# Patient Record
Sex: Male | Born: 1995
Health system: Southern US, Community
[De-identification: ages and names within clinical notes are randomized; demographics above are authoritative.]

## PROBLEM LIST (undated history)

## (undated) DIAGNOSIS — J189 Pneumonia, unspecified organism: Secondary | ICD-10-CM

## (undated) DIAGNOSIS — J45909 Unspecified asthma, uncomplicated: Secondary | ICD-10-CM

## (undated) HISTORY — DX: Unspecified asthma, uncomplicated: J45.909

---
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
## 2016-08-01 ENCOUNTER — Emergency Department (HOSPITAL_COMMUNITY)
Admission: EM | Admit: 2016-08-01 | Discharge: 2016-08-01 | Disposition: A | Discharge status: Home / Self Care | Payer: No Typology Code available for payment source | Attending: Emergency Medicine | Admitting: Emergency Medicine

## 2016-08-01 ENCOUNTER — Encounter (HOSPITAL_COMMUNITY): Payer: Self-pay | Admitting: Emergency Medicine

## 2016-08-01 DIAGNOSIS — Y939 Activity, unspecified: Secondary | ICD-10-CM | POA: Diagnosis not present

## 2016-08-01 DIAGNOSIS — R51 Headache: Secondary | ICD-10-CM | POA: Insufficient documentation

## 2016-08-01 DIAGNOSIS — Z79899 Other long term (current) drug therapy: Secondary | ICD-10-CM | POA: Diagnosis not present

## 2016-08-01 DIAGNOSIS — M542 Cervicalgia: Secondary | ICD-10-CM | POA: Diagnosis not present

## 2016-08-01 DIAGNOSIS — J45909 Unspecified asthma, uncomplicated: Secondary | ICD-10-CM | POA: Insufficient documentation

## 2016-08-01 DIAGNOSIS — M7918 Myalgia, other site: Secondary | ICD-10-CM

## 2016-08-01 DIAGNOSIS — Y92410 Unspecified street and highway as the place of occurrence of the external cause: Secondary | ICD-10-CM | POA: Diagnosis not present

## 2016-08-01 DIAGNOSIS — Y999 Unspecified external cause status: Secondary | ICD-10-CM | POA: Insufficient documentation

## 2016-08-01 MED ORDER — IBUPROFEN 200 MG PO TABS
600.0000 mg | ORAL_TABLET | Freq: Once | ORAL | Status: AC
  Administered 2016-08-01: 600 mg via ORAL
  Filled 2016-08-01: qty 3

## 2016-08-01 MED ORDER — METHOCARBAMOL 500 MG PO TABS
500.0000 mg | ORAL_TABLET | Freq: Once | ORAL | Status: AC
  Administered 2016-08-01: 500 mg via ORAL
  Filled 2016-08-01: qty 1

## 2016-08-01 MED ORDER — METHOCARBAMOL 500 MG PO TABS: 500.0000 mg | ORAL_TABLET | Freq: Two times a day (BID) | ORAL | 0 refills | Status: DC

## 2016-08-01 MED ORDER — NAPROXEN 500 MG PO TABS
500.0000 mg | ORAL_TABLET | Freq: Two times a day (BID) | ORAL | 0 refills | Status: DC
Start: 2016-08-01 — End: 2018-05-12
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
## 2017-03-24 NOTE — ED Notes (Signed)
Pt. Called no response x 2. 

## 2017-03-25 ENCOUNTER — Encounter (HOSPITAL_COMMUNITY): Payer: Self-pay | Admitting: *Deleted

## 2017-03-25 ENCOUNTER — Emergency Department (HOSPITAL_COMMUNITY): Admission: EM | Admit: 2017-03-25 | Discharge: 2017-03-25 | Discharge status: Against Medical Advice | Payer: Self-pay

## 2017-03-25 ENCOUNTER — Ambulatory Visit (HOSPITAL_COMMUNITY)
Admission: EM | Admit: 2017-03-25 | Discharge: 2017-03-25 | Disposition: A | Discharge status: Home / Self Care | Payer: Self-pay | Attending: Emergency Medicine | Admitting: Emergency Medicine

## 2017-03-25 DIAGNOSIS — R0981 Nasal congestion: Secondary | ICD-10-CM

## 2017-03-25 DIAGNOSIS — R05 Cough: Secondary | ICD-10-CM

## 2017-03-25 DIAGNOSIS — J4521 Mild intermittent asthma with (acute) exacerbation: Secondary | ICD-10-CM

## 2017-03-25 DIAGNOSIS — R0982 Postnasal drip: Secondary | ICD-10-CM

## 2017-03-25 MED ORDER — FEXOFENADINE HCL 180 MG PO TABS: 180.0000 mg | ORAL_TABLET | Freq: Every day | ORAL | 0 refills | Status: DC

## 2017-03-25 MED ORDER — FLUTICASONE PROPIONATE 50 MCG/ACT NA SUSP: 1.0000 | Freq: Every day | NASAL | 2 refills | Status: DC

## 2017-03-25 MED ORDER — PREDNISONE 10 MG (21) PO TBPK: ORAL_TABLET | Freq: Every day | ORAL | 0 refills | Status: DC

## 2017-03-25 MED ORDER — IPRATROPIUM-ALBUTEROL 0.5-2.5 (3) MG/3ML IN SOLN
RESPIRATORY_TRACT | Status: AC
  Filled 2017-03-25: qty 3

## 2017-03-25 MED ORDER — IPRATROPIUM-ALBUTEROL 0.5-2.5 (3) MG/3ML IN SOLN
3.0000 mL | Freq: Once | RESPIRATORY_TRACT | Status: AC
  Administered 2017-03-25: 3 mL via RESPIRATORY_TRACT

## 2017-03-25 MED ORDER — DM-GUAIFENESIN ER 30-600 MG PO TB12: 1.0000 | ORAL_TABLET | Freq: Two times a day (BID) | ORAL | 0 refills | Status: DC

## 2017-03-25 NOTE — ED Provider Notes (Signed)
[facility]    CSN: [id number] Arrival date & time: 03/25/17  1000     History   Chief Complaint Chief Complaint  Patient presents with  . Cough  . Wheezing    HPI Kenneth Heath is a 21 y.o. male with PMH of Asthma presented to clinic with CC productive cough with thick yellow sputum. SOB, wheezing, chest tightness since Monday. Pt taking Albuterol INH with no relief in Sx. Denies fever/chills. Denies sick contact. On Exam pt diffusely wheezing.Vitals stable. No acute visible distress.  The history is provided by the patient.  Cough  Cough characteristics:  Productive Sputum characteristics:  Yellow Severity:  Moderate Onset quality:  Sudden Timing:  Constant Progression:  Worsening Relieved by:  Beta-agonist inhaler Ineffective treatments:  Beta-agonist inhaler Associated symptoms: shortness of breath and wheezing   Wheezing  Associated symptoms: cough and shortness of breath     Past Medical History:  Diagnosis Date  . Asthma     There are no active problems to display for this patient.   History reviewed. No pertinent surgical history.     Home Medications    Prior to Admission medications   Medication Sig Start Date End Date Taking? Authorizing Provider  albuterol (PROAIR HFA) 108 (90 Base) MCG/ACT inhaler Inhale 2 puffs into the lungs every 6 (six) hours as needed for wheezing or shortness of breath.    [provider]  azithromycin (ZITHROMAX Z-PAK) 250 MG tablet Take 2 tablets on day 1, then 1 tablet on days 2 through 5. Patient not taking: Reported on 09/27/2015 06/07/15   Avanell Shackleton, NP-C  dextromethorphan-guaiFENesin [facility] DM) 30-600 MG 12hr tablet Take 1 tablet by mouth 2 (two) times daily. 03/25/17   Saory Carriero, NP  fexofenadine (ALLEGRA ALLERGY) 180 MG tablet Take 1 tablet (180 mg total) by mouth daily. 03/25/17   Aaryn Sermon, NP  fluticasone (FLONASE) 50 MCG/ACT nasal spray Place 1 spray into both  nostrils daily. 03/25/17   Adamari Frede, NP  methocarbamol (ROBAXIN) 500 MG tablet Take 1 tablet (500 mg total) by mouth 2 (two) times daily. 08/01/16   Audry Pili, PA-C  naproxen (NAPROSYN) 500 MG tablet Take 1 tablet (500 mg total) by mouth 2 (two) times daily. 08/01/16   Audry Pili, PA-C  predniSONE (STERAPRED UNI-PAK 21 TAB) 10 MG (21) TBPK tablet Take by mouth daily. Take 6 tabs by mouth daily  for 2 days, then 5 tabs for 2 days, then 4 tabs for 2 days, then 3 tabs for 2 days, 2 tabs for 2 days, then 1 tab by mouth daily for 2 days 03/25/17   Preston Weill, NP  Pseudoeph-Doxylamine-DM-APAP (NYQUIL PO) Take by mouth.    [provider]    Family History History reviewed. No pertinent family history.  Social History Social History  Substance Use Topics  . Smoking status: Never Smoker  . Smokeless tobacco: Never Used  . Alcohol use No     Allergies   Patient has no known allergies.   Review of Systems Review of Systems  Constitutional: Negative.   HENT: Positive for congestion and postnasal drip.   Respiratory: Positive for cough, shortness of breath and wheezing.   Neurological: Negative.      Physical Exam Triage Vital Signs ED Triage Vitals [03/25/17 1014]  Enc Vitals Group     BP 121/84     Pulse Rate 87     Resp 17     Temp 98.7 F (37.1 C)  Temp Source Oral     SpO2 100 %     Weight      Height      Head Circumference      Peak Flow      Pain Score      Pain Loc      Pain Edu?      Excl. in GC?    No data found.   Updated Vital Signs BP 121/84   Pulse 87   Temp 98.7 F (37.1 C) (Oral)   Resp 17   SpO2 100%   Visual Acuity Right Eye Distance:   Left Eye Distance:   Bilateral Distance:    Right Eye Near:   Left Eye Near:    Bilateral Near:     Physical Exam  Constitutional: He appears well-developed and well-nourished. No distress.  HENT:  Head: Normocephalic.  Nose: Mucosal edema (B/L nasal mucosal inflamation  with Inflammed turbinates B/L) present. No sinus tenderness.  Eyes: Pupils are equal, round, and reactive to light. Conjunctivae and EOM are normal.  Neck: Normal range of motion.  Cardiovascular: Normal rate and regular rhythm.   Pulmonary/Chest: Effort normal. No respiratory distress. He has wheezes (Diffuse wheezing to auscultation. No airflow limitation ). He exhibits no tenderness.  Skin: He is not diaphoretic.     UC Treatments / Results  Labs (all labs ordered are listed, but only abnormal results are displayed) Labs Reviewed - No data to display  EKG  EKG Interpretation None       Radiology No results found.  Procedures Procedures (including critical care time)  Medications Ordered in UC Medications  ipratropium-albuterol (DUONEB) 0.5-2.5 (3) MG/3ML nebulizer solution 3 mL (3 mLs Nebulization Given 03/25/17 1031)     Initial Impression / Assessment and Plan / UC Course  I have reviewed the triage vital signs and the nursing notes.  Pertinent labs & imaging results that were available during my care of the patient were reviewed by me and considered in my medical decision making (see chart for details).   Sx highly likely of Acute Asthma with exacerbation secondary to Viral URI. No signs of bacterial infection. No indication for ABX Tx. Pt verbalizes understanding.  Final Clinical Impressions(s) / UC Diagnoses   Final diagnoses:  Mild intermittent asthma with acute exacerbation  Nasal congestion  Post-nasal drip    New Prescriptions New Prescriptions   DEXTROMETHORPHAN-GUAIFENESIN (MUCINEX DM) 30-600 MG 12HR TABLET    Take 1 tablet by mouth 2 (two) times daily.   FEXOFENADINE (ALLEGRA ALLERGY) 180 MG TABLET    Take 1 tablet (180 mg total) by mouth daily.   FLUTICASONE (FLONASE) 50 MCG/ACT NASAL SPRAY    Place 1 spray into both nostrils daily.   PREDNISONE (STERAPRED UNI-PAK 21 TAB) 10 MG (21) TBPK TABLET    Take by mouth daily. Take 6 tabs by mouth daily   for 2 days, then 5 tabs for 2 days, then 4 tabs for 2 days, then 3 tabs for 2 days, 2 tabs for 2 days, then 1 tab by mouth daily for 2 days     Controlled Substance Prescriptions [place] Controlled Substance Registry consulted? Not Applicable   Reinaldo RaddleMultani, Freddy Kinne, NP 03/25/17 1032

## 2017-03-25 NOTE — ED Triage Notes (Signed)
Patient reports cough, congestion, and sob/wheezing since Monday. Patient reports history of asthma.

## 2017-03-25 NOTE — Discharge Instructions (Signed)
Continue Albuterol Inhaler as advised. Saline nasal rinse to flush nasal passages.

## 2018-02-22 ENCOUNTER — Other Ambulatory Visit: Payer: Self-pay

## 2018-02-22 ENCOUNTER — Encounter (HOSPITAL_COMMUNITY): Payer: Self-pay

## 2018-02-22 ENCOUNTER — Ambulatory Visit (HOSPITAL_COMMUNITY)
Admission: EM | Admit: 2018-02-22 | Discharge: 2018-02-22 | Disposition: A | Discharge status: Home / Self Care | Payer: Self-pay | Attending: Family Medicine | Admitting: Family Medicine

## 2018-02-22 DIAGNOSIS — J4521 Mild intermittent asthma with (acute) exacerbation: Secondary | ICD-10-CM

## 2018-02-22 MED ORDER — PREDNISONE 10 MG PO TABS: 20.0000 mg | ORAL_TABLET | Freq: Every day | ORAL | 0 refills | Status: DC

## 2018-02-22 MED ORDER — ALBUTEROL SULFATE HFA 108 (90 BASE) MCG/ACT IN AERS: 2.0000 | INHALATION_SPRAY | Freq: Four times a day (QID) | RESPIRATORY_TRACT | 1 refills | Status: DC | PRN

## 2018-02-22 MED ORDER — AZITHROMYCIN 250 MG PO TABS: ORAL_TABLET | ORAL | 0 refills | Status: DC

## 2018-02-22 NOTE — ED Provider Notes (Signed)
[facility]    CSN: [id number] Arrival date & time: 02/22/18  0800     History   Chief Complaint Chief Complaint  Patient presents with  . Cough    HPI Kenneth Heath is a 22 y.o. male.   Patient has a productive cough with green sputum.  Has had no fever or chills.  He does have a history of asthma and uses an inhaler occasionally.  Also complains of postnasal drainage but denies sinus pressure  HPI  Past Medical History:  Diagnosis Date  . Asthma     There are no active problems to display for this patient.   History reviewed. No pertinent surgical history.     Home Medications    Prior to Admission medications   Medication Sig Start Date End Date Taking? Authorizing Provider  albuterol (PROAIR HFA) 108 (90 Base) MCG/ACT inhaler Inhale 2 puffs into the lungs every 6 (six) hours as needed for wheezing or shortness of breath.    [provider]  azithromycin (ZITHROMAX Z-PAK) 250 MG tablet Take 2 tablets on day 1, then 1 tablet on days 2 through 5. Patient not taking: Reported on 09/27/2015 06/07/15   Avanell ShackletonHenson, Vickie L, NP-C  dextromethorphan-guaiFENesin [facility](MUCINEX DM) 30-600 MG 12hr tablet Take 1 tablet by mouth 2 (two) times daily. Patient not taking: Reported on 02/22/2018 03/25/17   Multani, Bhupinder, NP  fexofenadine (ALLEGRA ALLERGY) 180 MG tablet Take 1 tablet (180 mg total) by mouth daily. Patient not taking: Reported on 02/22/2018 03/25/17   Multani, Bhupinder, NP  fluticasone (FLONASE) 50 MCG/ACT nasal spray Place 1 spray into both nostrils daily. Patient not taking: Reported on 02/22/2018 03/25/17   Multani, Bhupinder, NP  methocarbamol (ROBAXIN) 500 MG tablet Take 1 tablet (500 mg total) by mouth 2 (two) times daily. Patient not taking: Reported on 02/22/2018 08/01/16   Audry PiliMohr, Tyler, PA-C  naproxen (NAPROSYN) 500 MG tablet Take 1 tablet (500 mg total) by mouth 2 (two) times daily. Patient not taking: Reported on 02/22/2018 08/01/16   Audry PiliMohr,  Tyler, PA-C  predniSONE (STERAPRED UNI-PAK 21 TAB) 10 MG (21) TBPK tablet Take by mouth daily. Take 6 tabs by mouth daily  for 2 days, then 5 tabs for 2 days, then 4 tabs for 2 days, then 3 tabs for 2 days, 2 tabs for 2 days, then 1 tab by mouth daily for 2 days Patient not taking: Reported on 02/22/2018 03/25/17   Multani, Bhupinder, NP  Pseudoeph-Doxylamine-DM-APAP (NYQUIL PO) Take by mouth.    [provider]    Family History History reviewed. No pertinent family history.  Social History Social History   Tobacco Use  . Smoking status: Never Smoker  . Smokeless tobacco: Never Used  Substance Use Topics  . Alcohol use: No    Alcohol/week: 0.0 standard drinks  . Drug use: No     Allergies   Patient has no known allergies.   Review of Systems Review of Systems  HENT: Positive for congestion. Negative for sinus pressure and sinus pain.   Respiratory: Positive for cough and wheezing.   Cardiovascular: Negative.   Gastrointestinal: Negative.      Physical Exam Triage Vital Signs ED Triage Vitals  Enc Vitals Group     BP 02/22/18 0819 138/78     Pulse Rate 02/22/18 0819 (!) 111     Resp 02/22/18 0819 18     Temp 02/22/18 0819 97.9 F (36.6 C)     Temp Source 02/22/18 0819 Oral  SpO2 02/22/18 0819 100 %     Weight 02/22/18 0826 145 lb (65.8 kg)     Height --      Head Circumference --      Peak Flow --      Pain Score --      Pain Loc --      Pain Edu? --      Excl. in GC? --    No data found.  Updated Vital Signs BP 138/78 (BP Location: Left Arm)   Pulse (!) 111   Temp 97.9 F (36.6 C) (Oral)   Resp 18   Wt 65.8 kg   SpO2 100%   Visual Acuity Right Eye Distance:   Left Eye Distance:   Bilateral Distance:    Right Eye Near:   Left Eye Near:    Bilateral Near:     Physical Exam  Constitutional: He is oriented to person, place, and time. He appears well-developed and well-nourished.  HENT:  Head: Normocephalic.  Right Ear: External  ear normal.  Left Ear: External ear normal.  Cardiovascular: Normal rate, regular rhythm and normal heart sounds.  Pulmonary/Chest: Effort normal. He has wheezes.  Neurological: He is alert and oriented to person, place, and time.     UC Treatments / Results  Labs (all labs ordered are listed, but only abnormal results are displayed) Labs Reviewed - No data to display  EKG None  Radiology No results found.  Procedures Procedures (including critical care time)  Medications Ordered in UC Medications - No data to display  Initial Impression / Assessment and Plan / UC Course  I have reviewed the triage vital signs and the nursing notes.  Pertinent labs & imaging results that were available during my care of the patient were reviewed by me and considered in my medical decision making (see chart for details).     Asthma with acute exacerbation.  Will refill inhaler and put him on a 5-day course of prednisone Final Clinical Impressions(s) / UC Diagnoses   Final diagnoses:  None   Discharge Instructions   None    ED Prescriptions    None     Controlled Substance Prescriptions [place] Controlled Substance Registry consulted? No   Frederica Kuster, MD 02/22/18 [phone]

## 2018-02-22 NOTE — ED Triage Notes (Signed)
Pt states he has a cough and congestion.  X 1 week.

## 2018-03-31 ENCOUNTER — Ambulatory Visit (HOSPITAL_COMMUNITY)
Admission: EM | Admit: 2018-03-31 | Discharge: 2018-03-31 | Disposition: A | Discharge status: Home / Self Care | Payer: Self-pay | Attending: Family Medicine | Admitting: Family Medicine

## 2018-03-31 ENCOUNTER — Encounter (HOSPITAL_COMMUNITY): Payer: Self-pay | Admitting: Emergency Medicine

## 2018-03-31 DIAGNOSIS — T7840XA Allergy, unspecified, initial encounter: Secondary | ICD-10-CM

## 2018-03-31 DIAGNOSIS — R0982 Postnasal drip: Secondary | ICD-10-CM

## 2018-03-31 DIAGNOSIS — R0981 Nasal congestion: Secondary | ICD-10-CM

## 2018-03-31 DIAGNOSIS — R05 Cough: Secondary | ICD-10-CM

## 2018-03-31 MED ORDER — FLUTICASONE PROPIONATE 50 MCG/ACT NA SUSP: 1.0000 | Freq: Every day | NASAL | 2 refills | Status: DC

## 2018-03-31 MED ORDER — CETIRIZINE HCL 10 MG PO TABS: 10.0000 mg | ORAL_TABLET | Freq: Every day | ORAL | 0 refills | Status: DC

## 2018-03-31 MED ORDER — ALBUTEROL SULFATE HFA 108 (90 BASE) MCG/ACT IN AERS: 2.0000 | INHALATION_SPRAY | Freq: Four times a day (QID) | RESPIRATORY_TRACT | 1 refills | Status: DC | PRN

## 2018-03-31 NOTE — ED Notes (Signed)
Bed: UC01 Expected date:  Expected time:  Means of arrival:  Comments: 

## 2018-03-31 NOTE — ED Triage Notes (Signed)
Pt c/o congestion and cold symptoms, coughing, hx of asthma, using inhaler at home.

## 2018-03-31 NOTE — ED Provider Notes (Signed)
[facility]    CSN: [id number] Arrival date & time: 03/31/18  1606     History   Chief Complaint No chief complaint on file.   HPI Kenneth Heath is a 22 y.o. male.   Pt is a 22 year old male that presents with congestion, postnasal trip and mild cough. He does have hx of asthma and allergies. He was seen here about 2 weeks ago and given steroids, inhaler and abx. Symptoms did improve but he is still having the postnasal drip and nasal congestion. He denies any associated fever, chills, body aches.   ROS per HPI      Past Medical History:  Diagnosis Date  . Asthma     There are no active problems to display for this patient.   History reviewed. No pertinent surgical history.     Home Medications    Prior to Admission medications   Medication Sig Start Date End Date Taking? Authorizing Provider  albuterol (PROAIR HFA) 108 (90 Base) MCG/ACT inhaler Inhale 2 puffs into the lungs every 6 (six) hours as needed for wheezing or shortness of breath. 03/31/18   Omarrion Carmer, Gloris Manchester A, NP  azithromycin (ZITHROMAX Z-PAK) 250 MG tablet Take 2 tablets on day 1, then 1 tablet on days 2 through 5. Patient not taking: Reported on 03/31/2018 02/22/18   Frederica Kuster, MD  cetirizine (ZYRTEC) 10 MG tablet Take 1 tablet (10 mg total) by mouth daily. 03/31/18   Gabbi Whetstone, Gloris Manchester A, NP  dextromethorphan-guaiFENesin (MUCINEX DM) 30-600 MG 12hr tablet Take 1 tablet by mouth 2 (two) times daily. Patient not taking: Reported on 02/22/2018 03/25/17   Multani, Bhupinder, NP  fexofenadine (ALLEGRA ALLERGY) 180 MG tablet Take 1 tablet (180 mg total) by mouth daily. Patient not taking: Reported on 02/22/2018 03/25/17   Multani, Bhupinder, NP  fluticasone (FLONASE) 50 MCG/ACT nasal spray Place 1 spray into both nostrils daily. 03/31/18   Dahlia Byes A, NP  methocarbamol (ROBAXIN) 500 MG tablet Take 1 tablet (500 mg total) by mouth 2 (two) times daily. Patient not taking: Reported on 02/22/2018  08/01/16   Audry Pili, PA-C  naproxen (NAPROSYN) 500 MG tablet Take 1 tablet (500 mg total) by mouth 2 (two) times daily. Patient not taking: Reported on 02/22/2018 08/01/16   Audry Pili, PA-C  predniSONE (DELTASONE) 10 MG tablet Take 2 tablets (20 mg total) by mouth daily. Patient not taking: Reported on 03/31/2018 02/22/18   Frederica Kuster, MD  Pseudoeph-Doxylamine-DM-APAP (NYQUIL PO) Take by mouth.    [provider]    Family History No family history on file.  Social History Social History   Tobacco Use  . Smoking status: Never Smoker  . Smokeless tobacco: Never Used  Substance Use Topics  . Alcohol use: No    Alcohol/week: 0.0 standard drinks  . Drug use: No     Allergies   Patient has no known allergies.   Review of Systems Review of Systems   Physical Exam Triage Vital Signs ED Triage Vitals [03/31/18 1617]  Enc Vitals Group     BP 133/85     Pulse Rate 90     Resp 16     Temp 98.6 F (37 C)     Temp src      SpO2 99 %     Weight      Height      Head Circumference      Peak Flow      Pain Score 0  Pain Loc      Pain Edu?      Excl. in GC?    No data found.  Updated Vital Signs BP 133/85   Pulse 90   Temp 98.6 F (37 C)   Resp 16   SpO2 99%   Visual Acuity Right Eye Distance:   Left Eye Distance:   Bilateral Distance:    Right Eye Near:   Left Eye Near:    Bilateral Near:     Physical Exam  Constitutional: He appears well-developed and well-nourished.  Very pleasant. Non toxic or ill appearing.   HENT:  Head: Normocephalic and atraumatic.  Bilateral TMs normal Mild posterior oropharyngeal erythema with moderate amount of drainage.  No tonsillar swelling or exudates No adenopathy Bilateral moderate nasal turbinate swelling  Eyes: Conjunctivae are normal.  Neck: Normal range of motion.  Cardiovascular: Normal rate, regular rhythm and normal heart sounds.  Pulmonary/Chest: Effort normal and breath sounds normal.    Lungs clear in all fields. No dyspnea or distress. No retractions or nasal flaring.   Musculoskeletal: Normal range of motion.  Neurological: He is alert.  Skin: Skin is warm and dry. No rash noted. No erythema. No pallor.  Psychiatric: He has a normal mood and affect.  Nursing note and vitals reviewed.    UC Treatments / Results  Labs (all labs ordered are listed, but only abnormal results are displayed) Labs Reviewed - No data to display  EKG None  Radiology No results found.  Procedures Procedures (including critical care time)  Medications Ordered in UC Medications - No data to display  Initial Impression / Assessment and Plan / UC Course  I have reviewed the triage vital signs and the nursing notes.  Pertinent labs & imaging results that were available during my care of the patient were reviewed by me and considered in my medical decision making (see chart for details).     Most likely patient's symptoms are allergy related. We will do Zyrtec-D and Flonase Refilling patient's albuterol inhaler as needed he reports he lost his last one. Follow up as needed for continued or worsening symptoms  Final Clinical Impressions(s) / UC Diagnoses   Final diagnoses:  Allergic state, initial encounter     Discharge Instructions     We will treat her symptoms with Zyrtec-D and Flonase Usual albuterol inhaler as needed Follow up as needed for continued or worsening symptoms     ED Prescriptions    Medication Sig Dispense Auth. Provider   albuterol (PROAIR HFA) 108 (90 Base) MCG/ACT inhaler Inhale 2 puffs into the lungs every 6 (six) hours as needed for wheezing or shortness of breath. 6.7 g Sharia Averitt A, NP   fluticasone (FLONASE) 50 MCG/ACT nasal spray Place 1 spray into both nostrils daily. 15.8 g Laverle Pillard A, NP   cetirizine (ZYRTEC) 10 MG tablet Take 1 tablet (10 mg total) by mouth daily. 30 tablet Dahlia Byes A, NP     Controlled Substance  Prescriptions [place] Controlled Substance Registry consulted? Not Applicable   Janace Aris, NP 03/31/18 1656

## 2018-03-31 NOTE — Discharge Instructions (Signed)
We will treat her symptoms with Zyrtec-D and Flonase Usual albuterol inhaler as needed Follow up as needed for continued or worsening symptoms

## 2018-05-12 ENCOUNTER — Encounter (HOSPITAL_COMMUNITY): Payer: Self-pay | Admitting: Emergency Medicine

## 2018-05-12 ENCOUNTER — Ambulatory Visit (HOSPITAL_COMMUNITY)
Admission: EM | Admit: 2018-05-12 | Discharge: 2018-05-12 | Disposition: A | Discharge status: Home / Self Care | Payer: Self-pay | Attending: Family Medicine | Admitting: Family Medicine

## 2018-05-12 ENCOUNTER — Other Ambulatory Visit: Payer: Self-pay

## 2018-05-12 DIAGNOSIS — M545 Low back pain, unspecified: Secondary | ICD-10-CM

## 2018-05-12 DIAGNOSIS — S161XXA Strain of muscle, fascia and tendon at neck level, initial encounter: Secondary | ICD-10-CM

## 2018-05-12 MED ORDER — CYCLOBENZAPRINE HCL 10 MG PO TABS: 10.0000 mg | ORAL_TABLET | Freq: Two times a day (BID) | ORAL | 0 refills | Status: DC | PRN

## 2018-05-12 MED ORDER — PREDNISONE 50 MG PO TABS: 50.0000 mg | ORAL_TABLET | Freq: Every day | ORAL | 0 refills | Status: AC

## 2018-05-12 MED ORDER — KETOROLAC TROMETHAMINE 60 MG/2ML IM SOLN
INTRAMUSCULAR | Status: AC
  Filled 2018-05-12: qty 2

## 2018-05-12 MED ORDER — KETOROLAC TROMETHAMINE 60 MG/2ML IM SOLN
60.0000 mg | Freq: Once | INTRAMUSCULAR | Status: AC
Start: 2018-05-12 — End: 2018-05-12
  Administered 2018-05-12: 60 mg via INTRAMUSCULAR

## 2018-05-12 NOTE — Discharge Instructions (Signed)
Prednisone daily for 5 days with food; recommend taking earlier in day You may use flexeril as needed to help with pain. This is a muscle relaxer and causes sedation- please use only at bedtime or when you will be home and not have to drive/work Alternate ice and heat  Gentle stretching Avoid heavy lifting, avoid complete bed rest  Follow up if not having any improvement over next 1-2 weeks, continued stiffness, weakness, numbness or tingling
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
## 2018-06-14 ENCOUNTER — Emergency Department
Admission: EM | Admit: 2018-06-14 | Discharge: 2018-06-14 | Disposition: A | Discharge status: Home / Self Care | Payer: Self-pay | Source: Home / Self Care

## 2018-06-14 ENCOUNTER — Encounter: Payer: Self-pay | Admitting: *Deleted

## 2018-06-14 ENCOUNTER — Other Ambulatory Visit: Payer: Self-pay

## 2018-06-14 ENCOUNTER — Other Ambulatory Visit: Payer: Self-pay | Admitting: Family Medicine

## 2018-06-14 DIAGNOSIS — J4521 Mild intermittent asthma with (acute) exacerbation: Secondary | ICD-10-CM

## 2018-06-14 DIAGNOSIS — R05 Cough: Secondary | ICD-10-CM

## 2018-06-14 DIAGNOSIS — R059 Cough, unspecified: Secondary | ICD-10-CM

## 2018-06-14 DIAGNOSIS — R062 Wheezing: Secondary | ICD-10-CM

## 2018-06-14 DIAGNOSIS — R6889 Other general symptoms and signs: Secondary | ICD-10-CM

## 2018-06-14 MED ORDER — PREDNISONE 20 MG PO TABS: ORAL_TABLET | ORAL | 0 refills | Status: DC

## 2018-06-14 MED ORDER — ALBUTEROL SULFATE HFA 108 (90 BASE) MCG/ACT IN AERS: 2.0000 | INHALATION_SPRAY | RESPIRATORY_TRACT | 1 refills | Status: DC | PRN

## 2018-06-14 NOTE — Discharge Instructions (Addendum)
Drink plenty of fluids and try to get enough rest  Stay off work at least through tomorrow  Use the albuterol inhaler 2 inhalations every 4-6 hours as needed for the wheezing  Take prednisone 20 mg 3 pills daily for 2 days, then 2 daily for 2 days, then 1 daily for 2 days  Use your fluticasone (Flonase) nose spray 2 sprays twice daily for 3 days, then once daily  This is a viral illness and at this point I do not think there is any value in antibiotics.  Your past the time frame for using the Tamiflu antiviral medication.  We will just have you continue treating yourself symptomatically.  You can take an over-the-counter antihistamine decongestant if necessary for your sinuses (Claritin-D, Allegra-D, Zyrtec-D, or similar generic)  Return if worse.

## 2018-06-14 NOTE — ED Triage Notes (Signed)
Pt c/o productive cough, HA, nasal congestion, and wheezing x 3 days. Denies fever. He has taken Execidrin. Needs new rx for Albuterol inhaler.

## 2018-06-14 NOTE — ED Provider Notes (Signed)
[facility]    CSN: [id number] Arrival date & time: 06/14/18  1408     History   Chief Complaint No chief complaint on file.   HPI Yarnell Newby is a 23 y.o. male.   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  He has history of asthma problems in the past.  He got sick 3 days ago with a cough and stuffy nose.  He has not had any major fever though he has a tiny elevation here.  He complains of his left frontal headache.  He has been coughing and wheezing.  He had to miss work today.  He did not get a flu shot this year.  I stressed the importance of flu shots to him. Past Medical History:  Diagnosis Date  . Asthma     There are no active problems to display for this patient.   No past surgical history on file.     Home Medications    Prior to Admission medications   Medication Sig Start Date End Date Taking? Authorizing Provider  albuterol (PROAIR HFA) 108 (90 Base) MCG/ACT inhaler Inhale 2 puffs into the lungs every 6 (six) hours as needed for wheezing or shortness of breath. 03/31/18   Bast, Gloris Manchester A, NP  azithromycin (ZITHROMAX Z-PAK) 250 MG tablet Take 2 tablets on day 1, then 1 tablet on days 2 through 5. Patient not taking: Reported on 03/31/2018 02/22/18   Frederica Kuster, MD  cetirizine (ZYRTEC) 10 MG tablet Take 1 tablet (10 mg total) by mouth daily. 03/31/18   Dahlia Byes A, NP  cyclobenzaprine (FLEXERIL) 10 MG tablet Take 1 tablet (10 mg total) by mouth 2 (two) times daily as needed for muscle spasms. 05/12/18   Wieters, Hallie C, PA-C  dextromethorphan-guaiFENesin (MUCINEX DM) 30-600 MG 12hr tablet Take 1 tablet by mouth 2 (two) times daily. Patient not taking: Reported on 02/22/2018 03/25/17   Multani, Bhupinder, NP  fluticasone (FLONASE) 50 MCG/ACT nasal spray Place 1 spray into both nostrils daily. 03/31/18   Dahlia Byes A, NP  methocarbamol (ROBAXIN) 500 MG tablet Take 1 tablet (500 mg total) by mouth 2 (two) times daily. Patient not taking:  Reported on 02/22/2018 08/01/16   Audry Pili, PA-C  Pseudoeph-Doxylamine-DM-APAP (NYQUIL PO) Take by mouth.    [provider]    Family History No family history on file.  Social History Social History   Tobacco Use  . Smoking status: Never Smoker  . Smokeless tobacco: Never Used  Substance Use Topics  . Alcohol use: No    Alcohol/week: 0.0 standard drinks  . Drug use: No     Allergies   Patient has no known allergies.   Review of Systems Review of Systems Constitutional: Feels weak and tired. HEENT: Left sinus frontal area pain.  No sore throat.  Minimal nasal congestion. Respiratory: Wheezing as noted above and coughing Cardiovascular unremarkable GI unremarkable Neurologic: Left frontal headache Physical Exam Triage Vital Signs ED Triage Vitals  Enc Vitals Group     BP      Pulse      Resp      Temp      Temp src      SpO2      Weight      Height      Head Circumference      Peak Flow      Pain Score      Pain Loc      Pain Edu?  Excl. in GC?    No data found.  Updated Vital Signs There were no vitals taken for this visit.  Visual Acuity Right Eye Distance:   Left Eye Distance:   Bilateral Distance:    Right Eye Near:   Left Eye Near:    Bilateral Near:     Physical Exam No major distress.  TMs normal.  Tender left frontal sinus.  Nose clear.  Throat unremarkable.  Neck supple without significant nodes.  Chest has soft diffuse wheezes, accentuated on forced expiration.  Coughing.  UC Treatments / Results  Labs (all labs ordered are listed, but only abnormal results are displayed) Labs Reviewed - No data to display  EKG None  Radiology No results found.  Procedures Procedures (including critical care time)  Medications Ordered in UC Medications - No data to display  Initial Impression / Assessment and Plan / UC Course  I have reviewed the triage vital signs and the nursing notes.  Pertinent labs & imaging results  that were available during my care of the patient were reviewed by me and considered in my medical decision making (see chart for details).     Flulike illness.  Past the point of benefit of Tamiflu so will not test and will just treat symptomatically. Final Clinical Impressions(s) / UC Diagnoses   Final diagnoses:  None   Discharge Instructions   None    ED Prescriptions    None     Controlled Substance Prescriptions  Controlled Substance Registry consulted? No   Peyton Najjar, MD 06/14/18 [phone]

## 2018-07-05 ENCOUNTER — Ambulatory Visit (INDEPENDENT_AMBULATORY_CARE_PROVIDER_SITE_OTHER): Payer: Self-pay

## 2018-07-05 ENCOUNTER — Encounter (HOSPITAL_COMMUNITY): Payer: Self-pay | Admitting: Emergency Medicine

## 2018-07-05 ENCOUNTER — Ambulatory Visit (HOSPITAL_COMMUNITY)
Admission: EM | Admit: 2018-07-05 | Discharge: 2018-07-05 | Disposition: A | Discharge status: Home / Self Care | Payer: Self-pay | Attending: Family Medicine | Admitting: Family Medicine

## 2018-07-05 DIAGNOSIS — J189 Pneumonia, unspecified organism: Secondary | ICD-10-CM

## 2018-07-05 DIAGNOSIS — R062 Wheezing: Secondary | ICD-10-CM | POA: Insufficient documentation

## 2018-07-05 DIAGNOSIS — J181 Lobar pneumonia, unspecified organism: Secondary | ICD-10-CM | POA: Insufficient documentation

## 2018-07-05 DIAGNOSIS — R05 Cough: Secondary | ICD-10-CM | POA: Insufficient documentation

## 2018-07-05 DIAGNOSIS — R059 Cough, unspecified: Secondary | ICD-10-CM

## 2018-07-05 MED ORDER — PREDNISONE 10 MG (21) PO TBPK: ORAL_TABLET | Freq: Every day | ORAL | 0 refills | Status: DC

## 2018-07-05 MED ORDER — DOXYCYCLINE HYCLATE 100 MG PO CAPS: 100.0000 mg | ORAL_CAPSULE | Freq: Two times a day (BID) | ORAL | 0 refills | Status: DC

## 2018-07-05 MED ORDER — ALBUTEROL SULFATE HFA 108 (90 BASE) MCG/ACT IN AERS: 2.0000 | INHALATION_SPRAY | RESPIRATORY_TRACT | 2 refills | Status: DC | PRN

## 2018-07-05 MED ORDER — BENZONATATE 100 MG PO CAPS: ORAL_CAPSULE | ORAL | 0 refills | Status: DC

## 2018-07-05 NOTE — ED Triage Notes (Signed)
Pt here for cough and URI sx x 2 weeks  

## 2018-07-05 NOTE — ED Provider Notes (Signed)
[facility]   [id number] 07/05/18 Arrival Time: 1136  ASSESSMENT & PLAN:  1. Community acquired pneumonia of left lower lobe of lung (HCC)   2. Cough   3. Wheezing    I have personally viewed the imaging studies ordered this visit. Subtle LLL infiltrate.  Meds ordered this encounter  Medications  . doxycycline (VIBRAMYCIN) 100 MG capsule    Sig: Take 1 capsule (100 mg total) by mouth 2 (two) times daily.    Dispense:  20 capsule    Refill:  0  . benzonatate (TESSALON) 100 MG capsule    Sig: Take 1 capsule by mouth every 8 (eight) hours for cough.    Dispense:  21 capsule    Refill:  0  . predniSONE (STERAPRED UNI-PAK 21 TAB) 10 MG (21) TBPK tablet    Sig: Take by mouth daily. Take as directed.    Dispense:  21 tablet    Refill:  0  . albuterol (PROVENTIL HFA;VENTOLIN HFA) 108 (90 Base) MCG/ACT inhaler    Sig: Inhale 2 puffs into the lungs every 4 (four) hours as needed for wheezing or shortness of breath (cough, shortness of breath or wheezing.).    Dispense:  1 Inhaler    Refill:  2   Work note given. Discussed typical duration of symptoms. OTC symptom care as needed. Ensure adequate fluid intake and rest. May f/u with PCP or here as needed.  Reviewed expectations re: course of current medical issues. Questions answered. Outlined signs and symptoms indicating need for more acute intervention. Patient verbalized understanding. After Visit Summary given.   SUBJECTIVE: History from: patient. [facility] Note reviewed. Kenneth Heath is a 23 y.o. male who presents with complaint of nasal congestion, post-nasal drainage, and a persistent dry cough; without sore throat. Onset abrupt, between 1.5-2 weeks ago; with mild fatigue and body aches. SOB: none. Wheezing: mild to moderate and intermittent; using albuterol inhaler more frequently. Fever: questions low-grade. Overall normal PO intake without n/v. Known sick contacts: no. No specific or  significant aggravating or alleviating factors reported. OTC treatment: Zyrtec without relief.  Received flu shot this year: no.  Social History   Tobacco Use  Smoking Status Never Smoker  Smokeless Tobacco Never Used   ROS: As per HPI. All other systems negative   OBJECTIVE:  Vitals:   07/05/18 1202  BP: (!) 141/78  Pulse: (!) 107  Resp: 18  Temp: 99.5 F (37.5 C)  TempSrc: Oral  SpO2: 100%    General appearance: alert; appears fatigued HEENT: nasal congestion; clear runny nose; throat irritation secondary to post-nasal drainage Neck: supple without LAD CV: mild tachycardia; regular Lungs: unlabored respirations, symmetrical air entry with bilateral expiratory wheezing; cough: moderate Abd: soft Ext: no LE edema Skin: warm and dry Psychological: alert and cooperative; normal mood and affect  Imaging: Dg Chest 2 View  Result Date: 07/05/2018 CLINICAL DATA:  Low-grade fever.  Cough. EXAM: CHEST - 2 VIEW COMPARISON:  No prior. FINDINGS: Mediastinum and hilar structures normal. Calcified nodule right upper lobe consistent granuloma. Mild left base infiltrate consistent pneumonia. No pleural effusion or pneumothorax. Degenerative changes scoliosis thoracic spine. IMPRESSION: Mild left base infiltrate consistent with pneumonia. Electronically Signed   By: Maisie Fushomas  Register   On: 07/05/2018 12:50    No Known Allergies  Past Medical History:  Diagnosis Date  . Asthma     Social History   Socioeconomic History  . Marital status: Single    Spouse name: Not on  file  . Number of children: Not on file  . Years of education: Not on file  . Highest education level: Not on file  Occupational History  . Not on file  Social Needs  . Financial resource strain: Not on file  . Food insecurity:    Worry: Not on file    Inability: Not on file  . Transportation needs:    Medical: Not on file    Non-medical: Not on file  Tobacco Use  . Smoking status: Never Smoker  .  Smokeless tobacco: Never Used  Substance and Sexual Activity  . Alcohol use: No    Alcohol/week: 0.0 standard drinks  . Drug use: No  . Sexual activity: Not on file  Lifestyle  . Physical activity:    Days per week: Not on file    Minutes per session: Not on file  . Stress: Not on file  Relationships  . Social connections:    Talks on phone: Not on file    Gets together: Not on file    Attends religious service: Not on file    Active member of club or organization: Not on file    Attends meetings of clubs or organizations: Not on file    Relationship status: Not on file  . Intimate partner violence:    Fear of current or ex partner: Not on file    Emotionally abused: Not on file    Physically abused: Not on file    Forced sexual activity: Not on file  Other Topics Concern  . Not on file  Social History Narrative  . Not on file           Mardella Layman, MD 07/05/18 1302

## 2018-07-15 ENCOUNTER — Encounter (HOSPITAL_COMMUNITY): Payer: Self-pay | Admitting: Emergency Medicine

## 2018-07-15 ENCOUNTER — Ambulatory Visit (HOSPITAL_COMMUNITY)
Admission: EM | Admit: 2018-07-15 | Discharge: 2018-07-15 | Disposition: A | Discharge status: Home / Self Care | Payer: Self-pay | Attending: Family Medicine | Admitting: Family Medicine

## 2018-07-15 DIAGNOSIS — J4521 Mild intermittent asthma with (acute) exacerbation: Secondary | ICD-10-CM

## 2018-07-15 MED ORDER — PREDNISONE 10 MG (21) PO TBPK: ORAL_TABLET | Freq: Every day | ORAL | 0 refills | Status: DC

## 2018-07-15 NOTE — ED Provider Notes (Signed)
[facility]   [id number] 07/15/18 Arrival Time: 1604  ASSESSMENT & PLAN:  1. Mild intermittent asthma with acute exacerbation    Meds ordered this encounter  Medications  . predniSONE (STERAPRED UNI-PAK 21 TAB) 10 MG (21) TBPK tablet    Sig: Take by mouth daily. Take as directed.    Dispense:  21 tablet    Refill:  0   He may wait a day or two and see how he's doing before starting prednisone. Has inhaler to use. Asthma precautions given. OTC symptom care as needed.  Follow-up Information    [facility].   Specialty:  Urgent Care Why:  If symptoms worsen. Contact information: [address] [place] [address] [phone]          Reviewed expectations re: course of current medical issues. Questions answered. Outlined signs and symptoms indicating need for more acute intervention. Patient verbalized understanding. After Visit Summary given.  SUBJECTIVE: History from: patient.  Kenneth Heath is a 23 y.o. male who I saw here on 07/05/2018 for PNA. Has been improving. Still fatigued. Last dose of antibiotic today. Presents with complaint of intermittent chest tightness and wheezing after "being around some fumes at work yesterday." No worsening since onset. Inhaler with temporary help. No SOB. Describes wheezing as mild to moderate when present. Fever: no. Overall normal PO intake without n/v. Typically his asthma is well controlled. No OTC medications.  Social History   Tobacco Use  Smoking Status Never Smoker  Smokeless Tobacco Never Used   ROS: As per HPI. All other systems negative.  OBJECTIVE:  Vitals:   07/15/18 1626  BP: 110/66  Pulse: (!) 111  Resp: 18  Temp: 99.7 F (37.6 C)  SpO2: 98%    General appearance: alert; appears fatigued HEENT: nasal congestion; clear runny nose; throat irritation secondary to post-nasal drainage Neck: supple without LAD Cv: slight tachycardia; regular;  recheck pulse 102 Lungs: unlabored respirations, no expiratory wheezing here today; cough: mild; no significant respiratory distress; able to speak full sentences Skin: warm and dry Psychological: alert and cooperative; normal mood and affect  No Known Allergies  Past Medical History:  Diagnosis Date  . Asthma     Social History   Socioeconomic History  . Marital status: Single    Spouse name: Not on file  . Number of children: Not on file  . Years of education: Not on file  . Highest education level: Not on file  Occupational History  . Not on file  Social Needs  . Financial resource strain: Not on file  . Food insecurity:    Worry: Not on file    Inability: Not on file  . Transportation needs:    Medical: Not on file    Non-medical: Not on file  Tobacco Use  . Smoking status: Never Smoker  . Smokeless tobacco: Never Used  Substance and Sexual Activity  . Alcohol use: No    Alcohol/week: 0.0 standard drinks  . Drug use: No  . Sexual activity: Not on file  Lifestyle  . Physical activity:    Days per week: Not on file    Minutes per session: Not on file  . Stress: Not on file  Relationships  . Social connections:    Talks on phone: Not on file    Gets together: Not on file    Attends religious service: Not on file    Active member of club or organization: Not on  file    Attends meetings of clubs or organizations: Not on file    Relationship status: Not on file  . Intimate partner violence:    Fear of current or ex partner: Not on file    Emotionally abused: Not on file    Physically abused: Not on file    Forced sexual activity: Not on file  Other Topics Concern  . Not on file  Social History Narrative  . Not on file            Mardella LaymanHagler, Winfield Caba, MD 07/15/18 [phone]

## 2018-07-15 NOTE — ED Triage Notes (Signed)
Pt states he thinks he had an asthma attack yesterday, states he was exposed to some fumes at work, states he was here for pneumoniae 10 days.

## 2018-08-02 ENCOUNTER — Ambulatory Visit (HOSPITAL_COMMUNITY)
Admission: EM | Admit: 2018-08-02 | Discharge: 2018-08-02 | Disposition: A | Discharge status: Home / Self Care | Payer: Self-pay | Attending: Family Medicine | Admitting: Family Medicine

## 2018-08-02 ENCOUNTER — Encounter (HOSPITAL_COMMUNITY): Payer: Self-pay | Admitting: Emergency Medicine

## 2018-08-02 ENCOUNTER — Ambulatory Visit (INDEPENDENT_AMBULATORY_CARE_PROVIDER_SITE_OTHER): Payer: Self-pay

## 2018-08-02 DIAGNOSIS — J189 Pneumonia, unspecified organism: Secondary | ICD-10-CM

## 2018-08-02 DIAGNOSIS — J181 Lobar pneumonia, unspecified organism: Secondary | ICD-10-CM | POA: Insufficient documentation

## 2018-08-02 LAB — CBC WITH DIFFERENTIAL/PLATELET
Abs Immature Granulocytes: 0.04 10*3/uL (ref 0.00–0.07)
BASOS ABS: 0 10*3/uL (ref 0.0–0.1)
BASOS PCT: 0 %
EOS PCT: 0 %
Eosinophils Absolute: 0 10*3/uL (ref 0.0–0.5)
HCT: 40 % (ref 39.0–52.0)
Hemoglobin: 13.3 g/dL (ref 13.0–17.0)
Immature Granulocytes: 1 %
LYMPHS ABS: 1.7 10*3/uL (ref 0.7–4.0)
LYMPHS PCT: 29 %
MCH: 29.1 pg (ref 26.0–34.0)
MCHC: 33.3 g/dL (ref 30.0–36.0)
MCV: 87.5 fL (ref 80.0–100.0)
MONO ABS: 0.5 10*3/uL (ref 0.1–1.0)
MONOS PCT: 9 %
Neutro Abs: 3.4 10*3/uL (ref 1.7–7.7)
Neutrophils Relative %: 61 %
PLATELETS: 271 10*3/uL (ref 150–400)
RBC: 4.57 MIL/uL (ref 4.22–5.81)
RDW: 12.4 % (ref 11.5–15.5)
WBC: 5.6 10*3/uL (ref 4.0–10.5)
nRBC: 0 % (ref 0.0–0.2)

## 2018-08-02 MED ORDER — LEVOFLOXACIN 750 MG PO TABS: 750.0000 mg | ORAL_TABLET | Freq: Every day | ORAL | 0 refills | Status: DC

## 2018-08-02 MED ORDER — PREDNISONE 10 MG (21) PO TBPK: ORAL_TABLET | Freq: Every day | ORAL | 0 refills | Status: DC

## 2018-08-02 MED ORDER — IPRATROPIUM-ALBUTEROL 0.5-2.5 (3) MG/3ML IN SOLN
RESPIRATORY_TRACT | Status: AC
  Filled 2018-08-02: qty 3

## 2018-08-02 MED ORDER — IPRATROPIUM-ALBUTEROL 0.5-2.5 (3) MG/3ML IN SOLN
3.0000 mL | Freq: Once | RESPIRATORY_TRACT | Status: AC
  Administered 2018-08-02: 3 mL via RESPIRATORY_TRACT

## 2018-08-02 NOTE — ED Provider Notes (Signed)
[facility] CARE CENTER   [id number] 08/02/18 Arrival Time: 1221  ASSESSMENT & PLAN:  1. Pneumonia of both lower lobes due to infectious organism [facility])    I have personally viewed the imaging studies ordered this visit. Appears to have a multifocal pneumonia. No pneumothorax.  HIV testing sent. He reports it is ok to give HIV testing results over the phone.  Meds ordered this encounter  Medications  . ipratropium-albuterol (DUONEB) 0.5-2.5 (3) MG/3ML nebulizer solution 3 mL  . levofloxacin (LEVAQUIN) 750 MG tablet    Sig: Take 1 tablet (750 mg total) by mouth daily.    Dispense:  10 tablet    Refill:  0  . predniSONE (STERAPRED UNI-PAK 21 TAB) 10 MG (21) TBPK tablet    Sig: Take by mouth daily. Take as directed.    Dispense:  21 tablet    Refill:  0    Follow-up Information    [facility] In 3 days.   Specialty:  Urgent Care Why:  For recheck. Contact information: [address] [place] [address] [phone]       [facility] EMERGENCY DEPARTMENT.   Specialty:  Emergency Medicine Why:  If your symptoms worsen. Contact information: [address] [id number] mc [place] [address] [phone]         OTC symptom care as needed. Ensure adequate fluid intake and rest.  Work note provided.  Reviewed expectations re: course of current medical issues. Questions answered. Outlined signs and symptoms indicating need for more acute intervention. Patient verbalized understanding. After Visit Summary given.   SUBJECTIVE: History from: patient.  Kenneth Heath is a 23 y.o. male whom I saw last on 07/15/2018. At that time he was finishing doxycycline for LLL PNA. Felt he was improving. Decided to take prednisone prescribed at last visit. Still with feeling of "tightness" of his chest with coughing. Prednisone did help temporarily. Today presents with complaint of worsening  cough and feeling SOB. Worsening fatigue. Continued tightness of chest associated with what he reports as wheezing. Fever: questions subjective; questions chills. Overall normal PO intake without n/v. No specific or significant aggravating or alleviating factors reported. Ambulatory without difficulty. Questions slight worsening of SOB with exertion that resolves quickly with rest. No specific chest pain reported. Ambulatory without difficulty.  Reports no h/o frequent infections. He is bi-sexual; last sexual activity approx 2 months ago. Has never been tested for HIV.  OTC treatment: Tylenol and cough medicines with mild help.  Social History   Tobacco Use  Smoking Status Never Smoker  Smokeless Tobacco Never Used    ROS: As per HPI. All other systems negative.  OBJECTIVE:  Vitals:   08/02/18 1243  BP: 111/75  Pulse: (!) 110  Resp: 20  Temp: (!) 100.4 F (38 C)  SpO2: 98%    General appearance: alert; appears fatigued; skinny but does not appear wasted HEENT: nasal congestion; clear runny nose; throat irritation secondary to post-nasal drainage Neck: supple without LAD CV: tachycardic; regular rhythm Lungs: unlabored respirations, symmetrical air entry with mild bilateral expiratory wheezing; cough: mild; he is able to speak full sentences without pausing Abd: soft; non-tender Ext: no LE edema Skin: warm and dry; no lesions Psychological: alert and cooperative; normal mood and affect  Imaging: Dg Chest 2 View  Result Date: 08/02/2018 CLINICAL DATA:  23 year old male with a history shortness of breath fever and chills EXAM: CHEST - 2 VIEW COMPARISON:  07/05/2018 FINDINGS: Cardiomediastinal silhouette  unchanged in size and contour. Worsening reticulonodular opacity throughout the lungs, worst in the lower lobes. No pneumothorax or pleural fluid. IMPRESSION: Worsening reticulonodular opacities throughout the lungs, compatible with multifocal pneumonia. Atypical pathogens should  be considered given the appearance. Electronically Signed   By: Gilmer Mor D.O.   On: 08/02/2018 13:39   Dg Chest 2 View  Result Date: 07/05/2018 CLINICAL DATA:  Low-grade fever.  Cough. EXAM: CHEST - 2 VIEW COMPARISON:  No prior. FINDINGS: Mediastinum and hilar structures normal. Calcified nodule right upper lobe consistent granuloma. Mild left base infiltrate consistent pneumonia. No pleural effusion or pneumothorax. Degenerative changes scoliosis thoracic spine. IMPRESSION: Mild left base infiltrate consistent with pneumonia. Electronically Signed   By: Maisie Fus  Register   On: 07/05/2018 12:50    No Known Allergies  Past Medical History:  Diagnosis Date  . Asthma     Social History   Socioeconomic History  . Marital status: Single    Spouse name: Not on file  . Number of children: Not on file  . Years of education: Not on file  . Highest education level: Not on file  Occupational History  . Not on file  Social Needs  . Financial resource strain: Not on file  . Food insecurity:    Worry: Not on file    Inability: Not on file  . Transportation needs:    Medical: Not on file    Non-medical: Not on file  Tobacco Use  . Smoking status: Never Smoker  . Smokeless tobacco: Never Used  Substance and Sexual Activity  . Alcohol use: No    Alcohol/week: 0.0 standard drinks  . Drug use: No  . Sexual activity: Not on file  Lifestyle  . Physical activity:    Days per week: Not on file    Minutes per session: Not on file  . Stress: Not on file  Relationships  . Social connections:    Talks on phone: Not on file    Gets together: Not on file    Attends religious service: Not on file    Active member of club or organization: Not on file    Attends meetings of clubs or organizations: Not on file    Relationship status: Not on file  . Intimate partner violence:    Fear of current or ex partner: Not on file    Emotionally abused: Not on file    Physically abused: Not on file      Forced sexual activity: Not on file  Other Topics Concern  . Not on file  Social History Narrative  . Not on file           Mardella Layman, MD 08/03/18 [phone]

## 2018-08-02 NOTE — ED Triage Notes (Signed)
Pt has been seen here several times for the last month for sob and pneumonia, states it gets better and hes gotten worse again.

## 2018-08-03 LAB — HIV 1/2 AB DIFFERENTIATION
HIV 1 Ab: POSITIVE — AB
HIV 2 AB: NEGATIVE

## 2018-08-04 LAB — HIV ANTIBODY (ROUTINE TESTING W REFLEX): HIV Screen 4th Generation wRfx: REACTIVE

## 2018-08-05 ENCOUNTER — Ambulatory Visit (HOSPITAL_COMMUNITY)
Admission: EM | Admit: 2018-08-05 | Discharge: 2018-08-05 | Disposition: A | Discharge status: Home / Self Care | Payer: Self-pay | Attending: Family Medicine | Admitting: Family Medicine

## 2018-08-05 ENCOUNTER — Ambulatory Visit (INDEPENDENT_AMBULATORY_CARE_PROVIDER_SITE_OTHER): Payer: Self-pay

## 2018-08-05 ENCOUNTER — Encounter (HOSPITAL_COMMUNITY): Payer: Self-pay

## 2018-08-05 DIAGNOSIS — R0602 Shortness of breath: Secondary | ICD-10-CM

## 2018-08-05 DIAGNOSIS — R062 Wheezing: Secondary | ICD-10-CM

## 2018-08-05 DIAGNOSIS — B20 Human immunodeficiency virus [HIV] disease: Secondary | ICD-10-CM

## 2018-08-05 DIAGNOSIS — J4541 Moderate persistent asthma with (acute) exacerbation: Secondary | ICD-10-CM

## 2018-08-05 DIAGNOSIS — J181 Lobar pneumonia, unspecified organism: Secondary | ICD-10-CM

## 2018-08-05 HISTORY — DX: Pneumonia, unspecified organism: J18.9

## 2018-08-05 MED ORDER — IPRATROPIUM-ALBUTEROL 0.5-2.5 (3) MG/3ML IN SOLN
RESPIRATORY_TRACT | Status: AC
  Filled 2018-08-05: qty 3

## 2018-08-05 MED ORDER — IPRATROPIUM-ALBUTEROL 0.5-2.5 (3) MG/3ML IN SOLN
3.0000 mL | Freq: Once | RESPIRATORY_TRACT | Status: AC
  Administered 2018-08-05: 3 mL via RESPIRATORY_TRACT

## 2018-08-05 MED ORDER — PREDNISONE 10 MG PO TABS: 40.0000 mg | ORAL_TABLET | Freq: Every day | ORAL | 0 refills | Status: AC

## 2018-08-05 MED ORDER — ALBUTEROL SULFATE (2.5 MG/3ML) 0.083% IN NEBU: 2.5000 mg | INHALATION_SOLUTION | Freq: Four times a day (QID) | RESPIRATORY_TRACT | 12 refills | Status: AC | PRN

## 2018-08-05 NOTE — ED Provider Notes (Addendum)
[facility]    CSN: [id number] Arrival date & time: 08/05/18  1130     History   Chief Complaint Chief Complaint  Patient presents with  . Appointment    Had pneumonia  . (11:45)  Follow Up    HPI Kenneth Heath is a 23 y.o. male.   Patient is a 23 year old male that presents today for follow-up.  He was seen here on 1/28, 2/7 and 2/25.  At his previous visits he had been treated for pneumonia and bronchitis.  At his last visit on 08/02/18 he presented with worsening symptoms and was found to have multifocal, atypical pneumonia.  He was then treated in clinic with IM injection of Rocephin and sent home with more antibiotics.  He was tested for HIV at that time.  His test results came back positive.  He was made aware over the phone yesterday of his test results.  He is here today for follow-up.  He is reporting that his fatigue and energy level has improved.  Although he is having more increased shortness of breath.  He has been using the inhaler that he was prescribed without much relief. Reports that the nebulizer treatments work better.  He has been taking the medications as prescribed. Fevers have resolved but he is tachycardic today at 116. Denies any chest pain or palpitations, no recent traveling. No hx of PE or DVT.     ROS per HPI      Past Medical History:  Diagnosis Date  . Asthma   . Pneumonia     There are no active problems to display for this patient.   History reviewed. No pertinent surgical history.     Home Medications    Prior to Admission medications   Medication Sig Start Date End Date Taking? Authorizing Provider  albuterol (PROVENTIL HFA;VENTOLIN HFA) 108 (90 Base) MCG/ACT inhaler Inhale 2 puffs into the lungs every 4 (four) hours as needed for wheezing or shortness of breath (cough, shortness of breath or wheezing.). 07/05/18   Mardella Layman, MD  albuterol (PROVENTIL) (2.5 MG/3ML) 0.083% nebulizer solution Take 3 mLs (2.5 mg  total) by nebulization every 6 (six) hours as needed for wheezing or shortness of breath. 08/05/18   Dahlia Byes A, NP  benzonatate (TESSALON) 100 MG capsule Take 1 capsule by mouth every 8 (eight) hours for cough. 07/05/18   Mardella Layman, MD  doxycycline (VIBRAMYCIN) 100 MG capsule Take 1 capsule (100 mg total) by mouth 2 (two) times daily. Patient not taking: Reported on 07/15/2018 07/05/18   Mardella Layman, MD  fluticasone [facility]) 50 MCG/ACT nasal spray Place 1 spray into both nostrils daily. 03/31/18   Dahlia Byes A, NP  levofloxacin (LEVAQUIN) 750 MG tablet Take 1 tablet (750 mg total) by mouth daily. 08/02/18   Mardella Layman, MD  predniSONE (DELTASONE) 10 MG tablet Take 4 tablets (40 mg total) by mouth daily for 5 days. 08/05/18 08/10/18  Janace Aris, NP    Family History History reviewed. No pertinent family history.  Social History Social History   Tobacco Use  . Smoking status: Never Smoker  . Smokeless tobacco: Never Used  Substance Use Topics  . Alcohol use: No    Alcohol/week: 0.0 standard drinks  . Drug use: No     Allergies   Patient has no known allergies.   Review of Systems Review of Systems   Physical Exam Triage Vital Signs ED Triage Vitals  Enc Vitals Group     BP  08/05/18 1224 102/73     Pulse Rate 08/05/18 1224 (!) 116     Resp 08/05/18 1224 18     Temp 08/05/18 1224 (!) 97.5 F (36.4 C)     Temp Source 08/05/18 1224 Oral     SpO2 08/05/18 1224 100 %     Weight --      Height --      Head Circumference --      Peak Flow --      Pain Score 08/05/18 1229 0     Pain Loc --      Pain Edu? --      Excl. in GC? --    No data found.  Updated Vital Signs BP 102/73 (BP Location: Left Arm)   Pulse (!) 116   Temp (!) 97.5 F (36.4 C) (Oral)   Resp 18   SpO2 100%   Visual Acuity Right Eye Distance:   Left Eye Distance:   Bilateral Distance:    Right Eye Near:   Left Eye Near:    Bilateral Near:     Physical Exam Vitals signs and nursing  note reviewed.  Constitutional:      General: He is not in acute distress.    Appearance: Normal appearance. He is not ill-appearing, toxic-appearing or diaphoretic.  HENT:     Head: Normocephalic and atraumatic.     Right Ear: Tympanic membrane and ear canal normal.     Left Ear: Tympanic membrane and ear canal normal.     Nose: Nose normal.     Mouth/Throat:     Pharynx: Oropharynx is clear.  Eyes:     Conjunctiva/sclera: Conjunctivae normal.  Neck:     Musculoskeletal: Normal range of motion.  Cardiovascular:     Rate and Rhythm: Tachycardia present.     Pulses: Normal pulses.  Pulmonary:     Effort: Pulmonary effort is normal.     Breath sounds: Wheezing present.  Musculoskeletal: Normal range of motion.  Lymphadenopathy:     Cervical: No cervical adenopathy.  Skin:    General: Skin is warm and dry.  Neurological:     Mental Status: He is alert.  Psychiatric:        Mood and Affect: Mood normal.      UC Treatments / Results  Labs (all labs ordered are listed, but only abnormal results are displayed) Labs Reviewed - No data to display  EKG None  Radiology Dg Chest 2 View  Result Date: 08/05/2018 CLINICAL DATA:  Shortness of breath.  Recent pneumonia EXAM: CHEST - 2 VIEW COMPARISON:  February 2020 FINDINGS: There has been near complete clearing of infiltrate from the right base. Slight atelectasis remains in this area. Lungs elsewhere are clear. Heart size and pulmonary vascularity normal. No adenopathy. No bone lesions. IMPRESSION: Complete clearing of infiltrate from the right base. Slight residual atelectasis in this area. Lungs elsewhere clear. No adenopathy. Electronically Signed   By: Bretta Bang III M.D.   On: 08/05/2018 13:14    Procedures Procedures (including critical care time)  Medications Ordered in UC Medications  ipratropium-albuterol (DUONEB) 0.5-2.5 (3) MG/3ML nebulizer solution 3 mL (3 mLs Nebulization Given 08/05/18 1327)    Initial  Impression / Assessment and Plan / UC Course  I have reviewed the triage vital signs and the nursing notes.  Pertinent labs & imaging results that were available during my care of the patient were reviewed by me and considered in my medical decision making (see chart  for details).       Asthma exacerbation  Pt  a 23 year old male with recent diagnosis of HIV that is here for recheck of multifocal pneumonia. According to x-ray results the pneumonia has cleared. He reports he feels somewhat better but is still having some increased shortness of breath. He was mildly tachycardic but has been at previous visits.  No acute distress and he is non toxic or ill appearing.  He has been using his albuterol inhaler at home without any relief. He reports that the nebulizer treatments worked better He does not have these at home I feel that it is appropriate to send him home with nebulizer machine and have him finish the abx.  I am also prescribing prednisone daily for 5 days.   Newly dx HIV  Spoke with Dr. Drue Second with infectious disease and she feels this is an appropriate plan.  She was very kind and scheduled him for an appointment next week for follow up and new patient establishment.  Pt was explained all of this and went over questions and concerns.   He seemed very grateful about the care he has been given and has positive outlook on diagnosis.  Final Clinical Impressions(s) / UC Diagnoses   Final diagnoses:  Human immunodeficiency virus (HIV) disease (HCC)  Moderate persistent asthma with acute exacerbation     Discharge Instructions     X-ray looked good.  It appears the pneumonia is resolving. We gave you a DuoNeb here in clinic to help with your breathing I am going to prescribe some steroids to help with inflammation in your lungs We are also sending you home with a nebulizer machine to help with your breathing.  You can use this every 6 hours as needed for cough, wheezing,  shortness of breath You have an appointment next Thursday at 10:00 AM with infectious disease to follow-up Please do not miss his appointment Good luck to you and if you need anything we are here.     ED Prescriptions    Medication Sig Dispense Auth. Provider   predniSONE (DELTASONE) 10 MG tablet Take 4 tablets (40 mg total) by mouth daily for 5 days. 20 tablet Kaye Luoma A, NP   albuterol (PROVENTIL) (2.5 MG/3ML) 0.083% nebulizer solution Take 3 mLs (2.5 mg total) by nebulization every 6 (six) hours as needed for wheezing or shortness of breath. 75 mL Dahlia Byes A, NP     Controlled Substance Prescriptions [place] Controlled Substance Registry consulted? Not Applicable   Janace Aris, NP 08/05/18 1506    Dahlia Byes A, NP 08/22/18 1530

## 2018-08-05 NOTE — ED Triage Notes (Signed)
Pt presents for follow up / recheck , was seen a few days for pneumonia , pt feels better but is still having a lot of shortness of breath.

## 2018-08-05 NOTE — Discharge Instructions (Addendum)
X-ray looked good.  It appears the pneumonia is resolving. We gave you a DuoNeb here in clinic to help with your breathing I am going to prescribe some steroids to help with inflammation in your lungs We are also sending you home with a nebulizer machine to help with your breathing.  You can use this every 6 hours as needed for cough, wheezing, shortness of breath You have an appointment next Thursday at 10:00 AM with infectious disease to follow-up Please do not miss his appointment Good luck to you and if you need anything we are here.

## 2018-08-05 NOTE — ED Notes (Signed)
Nebulizer machine sent home with pt for home use.  Paperwork faxed to [employer] and placed in the folder.

## 2018-08-08 ENCOUNTER — Telehealth: Payer: Self-pay

## 2018-08-08 NOTE — Telephone Encounter (Signed)
Patient called stating he missed call earlier today and no message was left.   Confirmed appointment date and time. Advised patient his visit could be from 1-2 hours.  He states he is feeling anxious and was told this is a normal state for someone newly diagnosed.  Advised if anxiousness increases to call our office and we could make arrangements for him to see the counselor.   Laurell Josephs, RN

## 2018-08-10 DIAGNOSIS — B20 Human immunodeficiency virus [HIV] disease: Secondary | ICD-10-CM | POA: Insufficient documentation

## 2018-08-10 NOTE — Progress Notes (Signed)
Name: Kenneth Heath  DOB: 1995/11/22 MRN: [id number] PCP: Patient, No Pcp Per    Patient Active Problem List   Diagnosis Date Noted  . Healthcare maintenance 08/12/2018  . Adjustment disorder with mixed anxiety and depressed mood 08/12/2018  . HIV (human immunodeficiency virus infection) ([place]) 08/10/2018     Brief Narrative:  Kenneth Heath is a 23 y.o. male with HIV, Dx 07/2018. HIV Risk: MSM. History of OIs: none Entry to care labs pending  Previous Regimens: . naive  Genotypes: . pending  Subjective:  CC:  Newly diagnosed HIV patient here to start treatment. Nervous. Recovering from pneumonia and still with some shortness of breath.   HPI: Kenneth Heath is a 23 y.o. male.   He is from [place] town in [place], [place]. Came here to go to college and graduated from [employer]. He is interested in Sports coach school.   He has visited the ER several times this year on 1/28, 2/7 and 2/25 for pneumonia/bronchitis with findings that were concerning for multifocal/atypical pneumonia despite treatment with ceftriaxone IM and PO abx. At this time ER providers checked HIV screening which returned positive. He was made aware over the phone and brought back to further discuss. CXR reveals resolved pna at this time. ER provider d/w Dr. Baxter Flattery and arrangements were made for appointment today.   He is feeling better - no further fevers or cough but still with some residual shortness of breath with exertion. He does carry a h/o asthma in the past requiring inhalers. He was most recently treated with prednisone and inhalers for bronchitis/symptom care alone with doxycycline. He is very nervous about today's appointment and if he is going to die. He has never been tested for HIV in the past prior to visit in the ER. He reports that he is ready to start on mediations and do whatever I ask him to do to be healthy again. Asking about diet/exercise.   He does not have PCP and is uninsured. Not  currently sexually active but reports msm with versatile positions and all sites exposed.   Depression screen PHQ 2/9 08/11/2018  Decreased Interest 1  Down, Depressed, Hopeless 1  PHQ - 2 Score 2  Altered sleeping 1  Tired, decreased energy 1  Change in appetite 1  Feeling bad or failure about yourself  1  Trouble concentrating 1  Moving slowly or fidgety/restless 1  Suicidal thoughts 1  PHQ-9 Score 9   Review of Systems  Constitutional: Negative for chills, fever, malaise/fatigue and weight loss.  HENT: Negative for sore throat.        No dental problems  Eyes: Negative for blurred vision.  Respiratory: Positive for cough and shortness of breath. Negative for sputum production and wheezing.   Cardiovascular: Negative for chest pain and leg swelling.  Gastrointestinal: Negative for abdominal pain, diarrhea and vomiting.  Genitourinary: Negative for dysuria and flank pain.  Musculoskeletal: Negative for joint pain, myalgias and neck pain.  Skin: Negative for rash.  Neurological: Negative for dizziness, tingling and headaches.  Psychiatric/Behavioral: Negative for depression and substance abuse. The patient is nervous/anxious.     Past Medical History:  Diagnosis Date  . Asthma   . Pneumonia     Outpatient Medications Prior to Visit  Medication Sig Dispense Refill  . albuterol (PROVENTIL HFA;VENTOLIN HFA) 108 (90 Base) MCG/ACT inhaler Inhale 2 puffs into the lungs every 4 (four) hours as needed for wheezing or shortness of breath (cough, shortness of breath or wheezing.).  1 Inhaler 2  . albuterol (PROVENTIL) (2.5 MG/3ML) 0.083% nebulizer solution Take 3 mLs (2.5 mg total) by nebulization every 6 (six) hours as needed for wheezing or shortness of breath. 75 mL 12  . doxycycline (VIBRAMYCIN) 100 MG capsule Take 1 capsule (100 mg total) by mouth 2 (two) times daily. 20 capsule 0  . levofloxacin (LEVAQUIN) 750 MG tablet Take 1 tablet (750 mg total) by mouth daily. 10 tablet 0  .  benzonatate (TESSALON) 100 MG capsule Take 1 capsule by mouth every 8 (eight) hours for cough. (Patient not taking: Reported on 08/11/2018) 21 capsule 0  . fluticasone (FLONASE) 50 MCG/ACT nasal spray Place 1 spray into both nostrils daily. (Patient not taking: Reported on 08/11/2018) 15.8 g 2   No facility-administered medications prior to visit.      No Known Allergies  Social History   Tobacco Use  . Smoking status: Never Smoker  . Smokeless tobacco: Never Used  Substance Use Topics  . Alcohol use: No    Alcohol/week: 0.0 standard drinks  . Drug use: No    Family History  Problem Relation Age of Onset  . Cancer Neg Hx   . Diabetes Neg Hx   . Heart disease Neg Hx     Social History   Substance and Sexual Activity  Sexual Activity Not on file     Objective:   Vitals:   08/11/18 1055  BP: 111/76  Pulse: (!) 116  Temp: 98.8 F (37.1 C)  Weight: 137 lb (62.1 kg)  Height: _0  (1.676 m)   Body mass index is 22.11 kg/m.  Physical Exam Constitutional:      General: He is not in acute distress.    Appearance: Normal appearance. He is well-developed. He is not ill-appearing.     Comments: Seated comfortably in chair during visit.   HENT:     Nose: No congestion or rhinorrhea.     Mouth/Throat:     Mouth: Mucous membranes are moist.     Dentition: Normal dentition. No dental abscesses.     Pharynx: Oropharynx is clear. No oropharyngeal exudate.  Eyes:     General: No scleral icterus.    Pupils: Pupils are equal, round, and reactive to light.  Cardiovascular:     Rate and Rhythm: Normal rate and regular rhythm.     Heart sounds: Normal heart sounds.  Pulmonary:     Effort: Pulmonary effort is normal. No respiratory distress.     Breath sounds: Normal breath sounds. No wheezing, rhonchi or rales.  Abdominal:     General: There is no distension.     Palpations: Abdomen is soft.     Tenderness: There is no abdominal tenderness.  Musculoskeletal: Normal range of  motion.  Lymphadenopathy:     Cervical: No cervical adenopathy.  Skin:    General: Skin is warm and dry.     Capillary Refill: Capillary refill takes less than 2 seconds.     Findings: No rash.  Neurological:     Mental Status: He is alert and oriented to person, place, and time.  Psychiatric:        Thought Content: Thought content normal.        Judgment: Judgment normal.     Comments: Anxious      Lab Results Lab Results  Component Value Date   WBC 8.7 08/11/2018   HGB 14.8 08/11/2018   HCT 44.5 08/11/2018   MCV 86.7 08/11/2018   PLT 304 08/11/2018  Lab Results  Component Value Date   CREATININE 0.94 08/11/2018   BUN 14 08/11/2018   NA 133 (L) 08/11/2018   K 4.4 08/11/2018   CL 96 (L) 08/11/2018   CO2 28 08/11/2018    Lab Results  Component Value Date   ALT 28 08/11/2018   AST 31 08/11/2018   BILITOT 0.7 08/11/2018    Lab Results  Component Value Date   CHOL 207 (H) 08/11/2018   HDL 40 08/11/2018   LDLCALC 143 (H) 08/11/2018   TRIG 122 08/11/2018   CHOLHDL 5.2 (H) 08/11/2018   CD4 T Cell Abs (/uL)  Date Value  08/11/2018 60 (L)     Assessment & Plan:   Problem List Items Addressed This Visit      Unprioritized   Adjustment disorder with mixed anxiety and depressed mood    New onset depressed mood/anxiety over diagnosis/prognosis and overall health. Explained that this is perfectly normal and we are here to support him to learn to deal with/understand his feelings/thoughts. We will have him meet with Rollene Fare for some talk therapy. No need for medications at this time. We discussed who his support team is and he has a close friend with HIV who is undetectable that has been talking with. I encouraged him to continue to discus with him and provided him with resources to read on his own as well.       Healthcare maintenance    Will obtain childhood vaccine records and hepatitis serology to determine further needs for vaccines.  Recommended prevnar + flu  today however he is overwhelmed and would like to defer for future visit.       HIV (human immunodeficiency virus infection) ([place]) - Primary    New patient here to establish for HIV care. AIDS status unknown with labs to be drawn today. He was diagnosed in the setting of prolonged pneumonia that may be explained by his h/o asthma but is a bit unusual for his age. He has no signs of OI today on exam.  I discussed with [name] treatment options/side effects, benefits of treatment and long-term outcomes. I discussed how HIV is transmitted and the process of untreated HIV including increased risk for opportunistic infections, cancer, dementia and renal failure. Patient was counseled on routine HIV care including medication adherence, blood monitoring, necessary vaccines and follow up visits. Counseled regarding safe sex practices including: condom use, partner disclosure, limiting partners. Patient spent time talking with our pharmacist Cassie regarding successful practices of ART and understands to reach out to our clinic in the future with questions.   Will start [place] for HIV treatment. He is uninsured and met with financial team today and found to be RW/HMAP eligible. Will return to clinic in 4 weeks to review lab work and continue HIV education. Discussed interval for re-application today and services provided on [place] HMAP. We will enroll him in [place] Advancing Access program to facilitate more urgently HAART d/t anxiety.   General introduction to our clinic and integrated services. Introduced to [place] and [place] today. Dental referral placed today for [facility]. Information to schedule appointment completed today.   I spent greater than 45 minutes with the patient today. Greater than 50% of the time spent face-to-face counseling and coordination of care re: HIV and health maintenance.        Relevant Medications   bictegravir-emtricitabine-tenofovir AF (BIKTARVY) 50-200-25 MG  TABS tablet   Other Relevant Orders   HIV-1 RNA ultraquant reflex to  gentyp+   T-helper cell (CD4)- ([facility] clinic only) (Completed)   COMPLETE METABOLIC PANEL WITH GFR (Completed)   CBC with Differential/Platelet (Completed)   Lipid panel (Completed)   Hepatitis B surface antibody,qualitative (Completed)   Hepatitis B surface antigen (Completed)   Hepatitis A antibody, total (Completed)   Hepatitis C antibody (Completed)   QuantiFERON-TB Gold Plus   HLA B*[id number]    Other Visit Diagnoses    Routine screening for STI (sexually transmitted infection)       Relevant Orders   RPR (Completed)      Janene Madeira, MSN, NP-C [facility] for [place] Pager: [phone] Office: [phone]  08/12/18  3:31 PM

## 2018-08-11 ENCOUNTER — Telehealth: Payer: Self-pay | Admitting: Pharmacy Technician

## 2018-08-11 ENCOUNTER — Ambulatory Visit (INDEPENDENT_AMBULATORY_CARE_PROVIDER_SITE_OTHER): Payer: Self-pay | Admitting: Infectious Diseases

## 2018-08-11 ENCOUNTER — Encounter: Payer: Self-pay | Admitting: Infectious Diseases

## 2018-08-11 ENCOUNTER — Other Ambulatory Visit: Payer: Self-pay | Admitting: *Deleted

## 2018-08-11 ENCOUNTER — Ambulatory Visit (INDEPENDENT_AMBULATORY_CARE_PROVIDER_SITE_OTHER): Payer: Self-pay | Admitting: Pharmacist

## 2018-08-11 VITALS — BP 111/76 | HR 116 | Temp 98.8°F | Ht 66.0 in | Wt 137.0 lb

## 2018-08-11 DIAGNOSIS — Z0000 Encounter for general adult medical examination without abnormal findings: Secondary | ICD-10-CM

## 2018-08-11 DIAGNOSIS — B20 Human immunodeficiency virus [HIV] disease: Secondary | ICD-10-CM

## 2018-08-11 DIAGNOSIS — Z21 Asymptomatic human immunodeficiency virus [HIV] infection status: Secondary | ICD-10-CM

## 2018-08-11 DIAGNOSIS — Z113 Encounter for screening for infections with a predominantly sexual mode of transmission: Secondary | ICD-10-CM

## 2018-08-11 DIAGNOSIS — F4323 Adjustment disorder with mixed anxiety and depressed mood: Secondary | ICD-10-CM

## 2018-08-11 MED ORDER — BICTEGRAVIR-EMTRICITAB-TENOFOV 50-200-25 MG PO TABS: 1.0000 | ORAL_TABLET | Freq: Every day | ORAL | 11 refills | Status: DC

## 2018-08-11 MED FILL — BIKTARVY 50-200-25 MG TABS: 50-200-25 | 30 days supply | Qty: 30 | Fill #0

## 2018-08-11 NOTE — Telephone Encounter (Addendum)
[facility] Patient Advocate Encounter   Patient has been approved for [employer] Advancing Access Patient Assistance Program for [place]  for 30-day coverage. This assistance will make the patient's copay $0.  I have spoken with the patient and they will pick up at [facility]. Picked up 08/11/2018 at 12:49pm  The billing information is as follows and has been shared with the pharmacy in RX30. Member ID: [id number] RxBin: [id number] PCN: [id number] Group: [id number]  Patient knows to call the office with questions or concerns. He will use this bridge until ADAP approved  Beulah Gandy, CPhT Specialty Pharmacy Patient [facility] for [facility] Disease Phone: [phone] Fax: [phone] 08/11/2018 11:55 AM

## 2018-08-11 NOTE — Patient Instructions (Addendum)
Nice to meet you today!  Biktarvy is the pill for your HIV infection - this will need to be taken once a day around the same time.  - Common side effects for a short time frame usually include headaches, nausea and diarrhea - OK to take over the counter tylenol for headaches and imodium for diarrhea - Try taking with food if you are nauseated  - Please do not take with multivitamin - separate by 4 hours.   Tips for Successful Daily Medication Habits: 1. Set a reminder on your phone  2. Try filling out a pill box for the week - pick a day and put one pill for every day during the week so you know right away if you missed a pill.  3. Have a trusted family member ask you about your medications.  4. Smartphone app   Remember every pill is precious and we want to be able to continue treating your with confidence the rest of your life.     Colace, Sennakot or Dulcolax are all over the counter things that can help you have a softer bowel movement.   Please come back to see pharmacy team in 4 weeks and Judeth Cornfield again in 2 months.   Constipation, Adult Constipation is when a person:  Poops (has a bowel movement) fewer times in a week than normal.  Has a hard time pooping.  Has poop that is dry, hard, or bigger than normal. Follow these instructions at home: Eating and drinking  Eat foods that have a lot of fiber, such as: ? Fresh fruits and vegetables. ? Whole grains. ? Beans.  Eat less of foods that are high in fat, low in fiber, or overly processed, such as: ? [place] fries. ? Hamburgers. ? Cookies. ? Candy. ? Soda.  Drink enough fluid to keep your pee (urine) clear or pale yellow. General instructions  Exercise regularly or as told by your doctor.  Go to the restroom when you feel like you need to poop. Do not hold it in.  Take over-the-counter and prescription medicines only as told by your doctor. These include any fiber supplements.  Do pelvic floor retraining  exercises, such as: ? Doing deep breathing while relaxing your lower belly (abdomen). ? Relaxing your pelvic floor while pooping.  Watch your condition for any changes.  Keep all follow-up visits as told by your doctor. This is important. Contact a doctor if:  You have pain that gets worse.  You have a fever.  You have not pooped for 4 days.  You throw up (vomit).  You are not hungry.  You lose weight.  You are bleeding from the anus.  You have thin, pencil-like poop (stool). Get help right away if:  You have a fever, and your symptoms suddenly get worse.  You leak poop or have blood in your poop.  Your belly feels hard or bigger than normal (is bloated).  You have very bad belly pain.  You feel dizzy or you faint. This information is not intended to replace advice given to you by your health care provider. Make sure you discuss any questions you have with your health care provider. Document Released: 11/11/2007 Document Revised: 12/13/2015 Document Reviewed: 11/13/2015 Elsevier Interactive Patient Education  2019 [employer].

## 2018-08-11 NOTE — Progress Notes (Signed)
HPI: Kenneth Heath is a 23 y.o. male who presents to the [facility] clinic today to initiate care with Judeth Cornfield for his newly diagnosed HIV infection.  Patient Active Problem List   Diagnosis Date Noted  . HIV (human immunodeficiency virus infection) ([facility]) 08/10/2018    Patient's Medications  New Prescriptions   BICTEGRAVIR-EMTRICITABINE-TENOFOVIR AF (BIKTARVY) 50-200-25 MG TABS TABLET    Take 1 tablet by mouth daily.  Previous Medications   ALBUTEROL (PROVENTIL HFA;VENTOLIN HFA) 108 (90 BASE) MCG/ACT INHALER    Inhale 2 puffs into the lungs every 4 (four) hours as needed for wheezing or shortness of breath (cough, shortness of breath or wheezing.).   ALBUTEROL (PROVENTIL) (2.5 MG/3ML) 0.083% NEBULIZER SOLUTION    Take 3 mLs (2.5 mg total) by nebulization every 6 (six) hours as needed for wheezing or shortness of breath.   BENZONATATE (TESSALON) 100 MG CAPSULE    Take 1 capsule by mouth every 8 (eight) hours for cough.   FLUTICASONE (FLONASE) 50 MCG/ACT NASAL SPRAY    Place 1 spray into both nostrils daily.  Modified Medications   No medications on file  Discontinued Medications   No medications on file    Allergies: No Known Allergies  Past Medical History: Past Medical History:  Diagnosis Date  . Asthma   . Pneumonia     Social History: Social History   Socioeconomic History  . Marital status: Single    Spouse name: Not on file  . Number of children: Not on file  . Years of education: Not on file  . Highest education level: Not on file  Occupational History  . Not on file  Social Needs  . Financial resource strain: Not on file  . Food insecurity:    Worry: Not on file    Inability: Not on file  . Transportation needs:    Medical: Not on file    Non-medical: Not on file  Tobacco Use  . Smoking status: Never Smoker  . Smokeless tobacco: Never Used  Substance and Sexual Activity  . Alcohol use: No    Alcohol/week: 0.0 standard drinks  . Drug use: No  .  Sexual activity: Not on file  Lifestyle  . Physical activity:    Days per week: Not on file    Minutes per session: Not on file  . Stress: Not on file  Relationships  . Social connections:    Talks on phone: Not on file    Gets together: Not on file    Attends religious service: Not on file    Active member of club or organization: Not on file    Attends meetings of clubs or organizations: Not on file    Relationship status: Not on file  Other Topics Concern  . Not on file  Social History Narrative  . Not on file    Labs: No results found for: HIV1RNAQUANT, HIV1RNAVL, CD4TABS  RPR and STI No results found for: LABRPR, RPRTITER  No flowsheet data found.  Hepatitis B No results found for: HEPBSAB, HEPBSAG, HEPBCAB Hepatitis C No results found for: HEPCAB, HCVRNAPCRQN Hepatitis A No results found for: HAV Lipids: No results found for: CHOL, TRIG, HDL, CHOLHDL, VLDL, LDLCALC  Current HIV Regimen: Treatment naive  Assessment: Kenneth Heath is here today to initiate care with Judeth Cornfield for his newly diagnosed HIV infection.  He is treatment naive and has not had labs drawn yet so unsure of his initial HIV viral load and CD4 count.  Will start patient on  Biktarvy for rapid start.  Explained that Susanne Borders is a one pill once daily medication with or without food and the importance of not missing any doses. Explained resistance and how it develops and why it is so important to take Biktarvy daily and not skip days or doses. Counseled patient to take it around the same time each day. Counseled on what to do if dose is missed, if closer to missed dose take immediately, if closer to next dose then skip and resume normal schedule.   Cautioned on possible side effects the first week or so including nausea, diarrhea, dizziness, and headaches but that they should resolve after the first couple of weeks. I reviewed patient medications and found no drug interactions. Counseled patient to separate  Biktarvy from divalent cations including multivitamins. Discussed with patient to call clinic if he starts a new medication or herbal supplement. I gave the patient my card and told him to call me with any issues/questions/concerns.  Patient is uninsured and applied for HMAP with Olegario Messier.  Lorene Dy was able to get him a 30 day supply from [employer] and set him up at [facility].  Plan: - Start Biktarvy PO once daily  Elih Mooney L. Jaimon Bugaj, PharmD, BCIDP, AAHIVP, CPP Infectious Diseases Clinical Pharmacist [facility] 08/11/2018, 4:41 PM

## 2018-08-11 NOTE — Progress Notes (Signed)
06/28/1995  

## 2018-08-11 NOTE — Telephone Encounter (Signed)
[facility] Patient Advocate Encounter    Findings of the benefits investigation conducted this morning via test claims for the patient's upcoming appointment on 08/11/2018 at 11:00am are as follows:   Insurance: uninsured, no coverage was able to be found on [employer], commercial or [employer] Estimated copay amount: $tbd Will obtain patient assistance once medication is chosen at time of appointment  [facility] Patient Advocate will follow up once patient arrives for their appointment.  Beulah Gandy, CPhT Specialty Pharmacy Patient [facility] for [facility] Phone: [phone] Fax: [phone] 08/11/2018 8:45 AM

## 2018-08-12 ENCOUNTER — Other Ambulatory Visit: Payer: Self-pay | Admitting: Pharmacist

## 2018-08-12 ENCOUNTER — Encounter: Payer: Self-pay | Admitting: Infectious Diseases

## 2018-08-12 DIAGNOSIS — F4323 Adjustment disorder with mixed anxiety and depressed mood: Secondary | ICD-10-CM | POA: Insufficient documentation

## 2018-08-12 DIAGNOSIS — Z0000 Encounter for general adult medical examination without abnormal findings: Secondary | ICD-10-CM | POA: Insufficient documentation

## 2018-08-12 DIAGNOSIS — B20 Human immunodeficiency virus [HIV] disease: Secondary | ICD-10-CM

## 2018-08-12 LAB — URINALYSIS, ROUTINE W REFLEX MICROSCOPIC
Bacteria, UA: NONE SEEN /HPF
Bilirubin Urine: NEGATIVE
Glucose, UA: NEGATIVE
Hgb urine dipstick: NEGATIVE
Hyaline Cast: NONE SEEN /LPF
KETONES UR: NEGATIVE
LEUKOCYTE UA: NEGATIVE
Nitrite: NEGATIVE
PH: 7 (ref 5.0–8.0)
RBC / HPF: NONE SEEN /HPF (ref 0–2)
Specific Gravity, Urine: 1.027 (ref 1.001–1.03)
Squamous Epithelial / HPF: NONE SEEN /HPF (ref <=5)
WBC, UA: NONE SEEN /HPF (ref 0–5)

## 2018-08-12 LAB — T-HELPER CELL (CD4) - (RCID CLINIC ONLY)
CD4 T CELL HELPER: 8 % — AB (ref 33–55)
CD4 T Cell Abs: 60 /uL — ABNORMAL LOW (ref 400–2700)

## 2018-08-12 LAB — URINE CYTOLOGY ANCILLARY ONLY
Chlamydia: NEGATIVE
Neisseria Gonorrhea: NEGATIVE

## 2018-08-12 MED ORDER — SULFAMETHOXAZOLE-TRIMETHOPRIM 800-160 MG PO TABS: 1.0000 | ORAL_TABLET | Freq: Every day | ORAL | 5 refills | Status: DC

## 2018-08-12 NOTE — Assessment & Plan Note (Signed)
New onset depressed mood/anxiety over diagnosis/prognosis and overall health. Explained that this is perfectly normal and we are here to support him to learn to deal with/understand his feelings/thoughts. We will have him meet with Rene Kocher for some talk therapy. No need for medications at this time. We discussed who his support team is and he has a close friend with HIV who is undetectable that has been talking with. I encouraged him to continue to discus with him and provided him with resources to read on his own as well.

## 2018-08-12 NOTE — Progress Notes (Signed)
Patient's CD4 returned very low at only 60 cells. I called him to let him know that he will also need to start on a second daily medication called bactrim to protect his immune system until we can get his HIV under better control. Would anticipate that he needs to take this regularly between 3-6 months. He is very nervous and worried about his health.

## 2018-08-12 NOTE — Assessment & Plan Note (Addendum)
New patient here to establish for HIV care. AIDS status unknown with labs to be drawn today. He was diagnosed in the setting of prolonged pneumonia that may be explained by his h/o asthma but is a bit unusual for his age. He has no signs of OI today on exam.  I discussed with [name] treatment options/side effects, benefits of treatment and long-term outcomes. I discussed how HIV is transmitted and the process of untreated HIV including increased risk for opportunistic infections, cancer, dementia and renal failure. Patient was counseled on routine HIV care including medication adherence, blood monitoring, necessary vaccines and follow up visits. Counseled regarding safe sex practices including: condom use, partner disclosure, limiting partners. Patient spent time talking with our pharmacist Cassie regarding successful practices of ART and understands to reach out to our clinic in the future with questions.   Will start [place] for HIV treatment. He is uninsured and met with financial team today and found to be RW/HMAP eligible. Will return to clinic in 4 weeks to review lab work and continue HIV education. Discussed interval for re-application today and services provided on [place] HMAP. We will enroll him in [place] Advancing Access program to facilitate more urgently HAART d/t anxiety.   General introduction to our clinic and integrated services. Introduced to [place] and [place] today. Dental referral placed today for [facility]. Information to schedule appointment completed today.   I spent greater than 45 minutes with the patient today. Greater than 50% of the time spent face-to-face counseling and coordination of care re: HIV and health maintenance.

## 2018-08-12 NOTE — Progress Notes (Signed)
Hep C negative. Hep A immune. Needs hep b vaccines - will discuss at upcoming appt and start series

## 2018-08-12 NOTE — Assessment & Plan Note (Signed)
Will obtain childhood vaccine records and hepatitis serology to determine further needs for vaccines.  Recommended prevnar + flu today however he is overwhelmed and would like to defer for future visit.

## 2018-08-12 NOTE — Progress Notes (Signed)
Sending in Bactrim DS to [facility] for patient since his CD4 count is so low.  Pharmacist at [facility] called saying patient needed Rx. Will assess how much it will cost him and resend to [facility] or [facility] if needed.

## 2018-08-16 ENCOUNTER — Other Ambulatory Visit: Payer: Self-pay | Admitting: *Deleted

## 2018-08-16 DIAGNOSIS — B20 Human immunodeficiency virus [HIV] disease: Secondary | ICD-10-CM

## 2018-08-16 MED ORDER — SULFAMETHOXAZOLE-TRIMETHOPRIM 800-160 MG PO TABS: 1.0000 | ORAL_TABLET | Freq: Every day | ORAL | 1 refills | Status: DC

## 2018-08-17 ENCOUNTER — Ambulatory Visit: Payer: Self-pay | Admitting: Licensed Clinical Social Worker

## 2018-08-21 LAB — COMPLETE METABOLIC PANEL WITH GFR
AG RATIO: 0.7 (calc) — AB (ref 1.0–2.5)
ALT: 28 U/L (ref 9–46)
AST: 31 U/L (ref 10–40)
Albumin: 3 g/dL — ABNORMAL LOW (ref 3.6–5.1)
Alkaline phosphatase (APISO): 59 U/L (ref 36–130)
BILIRUBIN TOTAL: 0.7 mg/dL (ref 0.2–1.2)
BUN: 14 mg/dL (ref 7–25)
CHLORIDE: 96 mmol/L — AB (ref 98–110)
CO2: 28 mmol/L (ref 20–32)
Calcium: 8.7 mg/dL (ref 8.6–10.3)
Creat: 0.94 mg/dL (ref 0.60–1.35)
GFR, EST AFRICAN AMERICAN: 134 mL/min/{1.73_m2} (ref >=60)
GFR, Est Non African American: 115 mL/min/{1.73_m2} (ref >=60)
Globulin: 4.5 g/dL (calc) — ABNORMAL HIGH (ref 1.9–3.7)
Glucose, Bld: 79 mg/dL (ref 65–99)
POTASSIUM: 4.4 mmol/L (ref 3.5–5.3)
Sodium: 133 mmol/L — ABNORMAL LOW (ref 135–146)
TOTAL PROTEIN: 7.5 g/dL (ref 6.1–8.1)

## 2018-08-21 LAB — QUANTIFERON-TB GOLD PLUS
Mitogen-NIL: 0.39 IU/mL
NIL: 0.09 IU/mL
QuantiFERON-TB Gold Plus: UNDETERMINED — AB
TB1-NIL: 0.01 IU/mL
TB2-NIL: 0.02 [IU]/mL

## 2018-08-21 LAB — CBC WITH DIFFERENTIAL/PLATELET
Absolute Monocytes: 653 cells/uL (ref 200–950)
BASOS ABS: 44 {cells}/uL (ref 0–200)
Basophils Relative: 0.5 %
EOS PCT: 0.2 %
Eosinophils Absolute: 17 cells/uL (ref 15–500)
HCT: 44.5 % (ref 38.5–50.0)
Hemoglobin: 14.8 g/dL (ref 13.2–17.1)
Lymphs Abs: 740 cells/uL — ABNORMAL LOW (ref 850–3900)
MCH: 28.8 pg (ref 27.0–33.0)
MCHC: 33.3 g/dL (ref 32.0–36.0)
MCV: 86.7 fL (ref 80.0–100.0)
MONOS PCT: 7.5 %
MPV: 8.8 fL (ref 7.5–12.5)
NEUTROS ABS: 7247 {cells}/uL (ref 1500–7800)
NEUTROS PCT: 83.3 %
Platelets: 304 10*3/uL (ref 140–400)
RBC: 5.13 10*6/uL (ref 4.20–5.80)
RDW: 12.4 % (ref 11.0–15.0)
Total Lymphocyte: 8.5 %
WBC: 8.7 10*3/uL (ref 3.8–10.8)

## 2018-08-21 LAB — HIV-1 RNA ULTRAQUANT REFLEX TO GENTYP+
HIV 1 RNA QUANT: 528000 {copies}/mL — AB
HIV-1 RNA QUANT, LOG: 5.72 {Log_copies}/mL — AB

## 2018-08-21 LAB — HEPATITIS C ANTIBODY
Hepatitis C Ab: NONREACTIVE
SIGNAL TO CUT-OFF: 0.04 (ref <1.00)

## 2018-08-21 LAB — RPR: RPR: NONREACTIVE

## 2018-08-21 LAB — LIPID PANEL
Cholesterol: 207 mg/dL — ABNORMAL HIGH (ref <200)
HDL: 40 mg/dL (ref >=40)
LDL CHOLESTEROL (CALC): 143 mg/dL — AB
NON-HDL CHOLESTEROL (CALC): 167 mg/dL — AB (ref <130)
Total CHOL/HDL Ratio: 5.2 (calc) — ABNORMAL HIGH (ref <5.0)
Triglycerides: 122 mg/dL (ref <150)

## 2018-08-21 LAB — HEPATITIS A ANTIBODY, TOTAL: Hepatitis A AB,Total: REACTIVE — AB

## 2018-08-21 LAB — HIV-1 GENOTYPE: HIV-1 Genotype: DETECTED — AB

## 2018-08-21 LAB — HEPATITIS B SURFACE ANTIGEN: Hepatitis B Surface Ag: NONREACTIVE

## 2018-08-21 LAB — HEPATITIS B SURFACE ANTIBODY,QUALITATIVE: HEP B S AB: NONREACTIVE

## 2018-08-21 LAB — HLA B*5701: HLA-B*5701 w/rflx HLA-B High: NEGATIVE

## 2018-08-25 ENCOUNTER — Ambulatory Visit: Payer: Self-pay | Admitting: Licensed Clinical Social Worker

## 2018-09-05 MED FILL — BIKTARVY 50-200-25 MG TABS: 50-200-25 | 30 days supply | Qty: 30 | Fill #1

## 2018-09-08 ENCOUNTER — Encounter: Payer: Self-pay | Admitting: *Deleted

## 2018-09-08 ENCOUNTER — Encounter: Payer: Self-pay | Admitting: Infectious Diseases

## 2018-09-08 ENCOUNTER — Ambulatory Visit (INDEPENDENT_AMBULATORY_CARE_PROVIDER_SITE_OTHER): Payer: Self-pay | Admitting: Licensed Clinical Social Worker

## 2018-09-08 ENCOUNTER — Other Ambulatory Visit: Payer: Self-pay

## 2018-09-08 ENCOUNTER — Ambulatory Visit (INDEPENDENT_AMBULATORY_CARE_PROVIDER_SITE_OTHER): Payer: Medicaid Other | Admitting: Infectious Diseases

## 2018-09-08 DIAGNOSIS — F4323 Adjustment disorder with mixed anxiety and depressed mood: Secondary | ICD-10-CM

## 2018-09-08 DIAGNOSIS — B20 Human immunodeficiency virus [HIV] disease: Secondary | ICD-10-CM | POA: Insufficient documentation

## 2018-09-08 NOTE — Progress Notes (Signed)
Name: Kenneth Heath  GBT:[id number]   DOB: 1996-04-29   PCP: Patient, No Pcp Per   Virtual Visit via Telephone Note  I connected with Kenneth Heath on 09/08/18 at 10:15 AM EDT by telephone and verified that I am speaking with the correct person using two identifiers.   I discussed the limitations, risks, security and privacy concerns of performing an evaluation and management service by telephone and the availability of in person appointments. I also discussed with the patient that there may be a patient responsible charge related to this service. The patient expressed understanding and agreed to proceed.   Chief Complaint  Patient presents with  . evisit    Feeling much better, "back to myself"; has support of mother, best friend.     History of Present Illness: Kenneth Heath is a 23 y.o. male with HIV, Dx 07/2018. CD4 nadir 60.  HIV Risk: MSM.  History of OIs: none Hep B sAg (-), Hep B sAb (-) / cAb (-), Hep A (+), Hep C (-), HLA B*5701 (-), Quantiferon (Indeterminant)   Previous Regimens:  Biktarvy   Genotypes:  07/2018: no PI/RTI mutations    He states he is feeling better physically. Emotionally he is "coming along" but is having more good days than bad days. On bad days he is just not himself and withdrawn, but these are shorter and shorter episodes and last about an hour now.  He is found good support with his mother and family members.  He reports excellent adherence with his Biktarvy.  He has had no missed doses and has been on this medication for nearly a month now.  He has no concern over side effects or access to his medication.  He is taking it every day in the morning on an empty stomach and does fine.  He has noticed that he has had a dramatic increase in his energy, starting to see some weight regain.  Appetite is much better.  He has logged into his my chart to review his labs but not quite sure what he is looking up.  He has been reading up on HIV  disease and is trying to educate himself.  Medical/surgical/social/family history have been updated during today's visit.    Observations/Objective: Kenneth Heath sounds to be in a very good mood on the phone today.  He is upbeat, cheerful, and very optimistic with a good outlook on his future.  HIV 1 RNA Quant (copies/mL)  Date Value  08/11/2018 528,000 (H)   CD4 T Cell Abs (/uL)  Date Value  08/11/2018 60 (L)    Lab Results  Component Value Date   CREATININE 0.94 08/11/2018    Lab Results  Component Value Date   WBC 8.7 08/11/2018   HGB 14.8 08/11/2018   HCT 44.5 08/11/2018   MCV 86.7 08/11/2018   PLT 304 08/11/2018    Lab Results  Component Value Date   ALT 28 08/11/2018   AST 31 08/11/2018   BILITOT 0.7 08/11/2018     Assessment and Plan: Problem List Items Addressed This Visit      Unprioritized   HIV (human immunodeficiency virus infection) ([place])    Kenneth Heath sounds to be doing very well early in treating his HIV disease.  He has taken this opportunity to provide education to himself, focus on his medications as prescribed and seek support from his family.  He is currently not sexually active.  He is excited to recheck his blood work in 3 weeks.  Lab orders have been entered.  He will continue his Biktarvy 1 pill once a day.  He will return to see me in 2 months to continue with HIV education and care.      AIDS (acquired immune deficiency syndrome) ([place]) - Primary    We will continue Bactrim 1 tab once a day until we see evidence of CD4 reconstitution.  I told him he is likely going to require this medication 3 to 6 months timeframe.  We discussed the difference between AIDS and HIV disease and the phone today      Relevant Orders   HIV-1 RNA quant-no reflex-bld   T-helper cell (CD4)- ([facility] clinic only)       Follow Up Instructions: Return to clinic for lab work in 3 weeks.  This appointment has been scheduled.  He will return to see me in the office in 2  months to follow-up.   I discussed the assessment and treatment plan with the patient. The patient was provided an opportunity to ask questions and all were answered. The patient agreed with the plan and demonstrated an understanding of the instructions.   The patient was advised to call back or seek an in-person evaluation if the symptoms worsen or if the condition fails to improve as anticipated.  I provided 30 minutes of non-face-to-face time during this encounter.   Janene Madeira, MSN, NP-C [facility] [email or web address] Pager: [phone] Office: [phone] [place]: [phone]

## 2018-09-08 NOTE — Patient Instructions (Signed)
It was very nice to talk to you on the phone today.  I am glad to hear that you are staying well and keeping healthy.  If you need extra support please feel free to reach out to our office and ask for 1 of our counselors.  Your medication is working perfectly for you and you are doing a great job taking it as prescribed  Please continue your Biktarvy 1 pill once a day as we discussed.  I cannot wait to see how your lab results have improved.  Vaccines Recommended:   Hepatitis B vaccine, pneumonia vaccine, meningitis vaccine, flu vaccine yearly.  We will provide these vaccines once your immune system has built back up a little bit.  Recommended Next Office Visit:   3 weeks for lab visit, 2 months for office visit.  Please continue to be well, stay away from folks that are sick, wash her hands frequently, do not touch her face or mouth and continue to take your medications.

## 2018-09-08 NOTE — Assessment & Plan Note (Signed)
We will continue Bactrim 1 tab once a day until we see evidence of CD4 reconstitution.  I told him he is likely going to require this medication 3 to 6 months timeframe.  We discussed the difference between AIDS and HIV disease and the phone today

## 2018-09-08 NOTE — Assessment & Plan Note (Signed)
Kenneth Heath sounds to be doing very well early in treating his HIV disease.  He has taken this opportunity to provide education to himself, focus on his medications as prescribed and seek support from his family.  He is currently not sexually active.  He is excited to recheck his blood work in 3 weeks.  Lab orders have been entered.  He will continue his Biktarvy 1 pill once a day.  He will return to see me in 2 months to continue with HIV education and care.

## 2018-09-09 ENCOUNTER — Encounter: Payer: Self-pay | Admitting: Infectious Diseases

## 2018-09-19 NOTE — Progress Notes (Signed)
Integrated Behavioral Health Visit via Telemedicine (Telephone)  09/19/2018 Kenneth Heath [id number]   Session Start time: 11:00am  Session End time: 11:30am Total time: 30 minutes   Type of Visit: Telephonic Patient location: Patient's home [facility] Provider location: [facility] All persons participating in visit: Counselor and Patient  Confirmed patient's address: Yes  Confirmed patient's phone number: Yes  Any changes to demographics: No   Confirmed patient's insurance: No  Any changes to patient's insurance: Yes   Discussed confidentiality: Yes    The following statements were read to the patient and/or legal guardian that are established with the [facility].  "The purpose of this phone visit is to provide behavioral health care while limiting exposure to the coronavirus (COVID19).  There is a possibility of technology failure and discussed alternative modes of communication if that failure occurs."  "By engaging in this telephone visit, you consent to the provision of healthcare.  Additionally, you authorize for your insurance to be billed for the services provided during this telephone visit."   Patient and/or legal guardian consented to telephone visit: Yes   PRESENTING CONCERNS: Patient and/or family reports the following symptoms/concerns: recent HIV diagnosis, some "down moods" and anxiety about pNrogress of disease and telling family about his diagnosis Duration of problem: 4-5 weeks, Severity of problem: moderate  STRENGTHS (Protective Factors/Coping Skills): Positive mindset, strong spiritual connection/background, supportive friend, access to resources  GOALS ADDRESSED: Patient will: 1.  Demonstrate ability to: Increase healthy adjustment to current life circumstances  INTERVENTIONS: Interventions utilized:  Motivational Interviewing and Brief CBT  ASSESSMENT: Patient currently experiencing short periods of feeling "down" and without motivation,  anxiety, and uncertain self-concept. He was diagnosed with HIV about 5 weeks ago and is struggling to accept and adjust to it. At this time, the diagnosis most appropriate for patient's symptoms is Adjustment Disorder with Mixed Anxiety and Depressed Mood.  Patient reports that he struggles when he has to take his medication or thinks about being HIV+, but has been reminding himself that this is a process he must go through. Counselor commended patient on this positive perspective. Counselor guided patient to explore his progress on accepting diagnosis. Patient states that he tries not to feel badly about himself because he knows that he is the same person as he was before, but that on "down days" he slips back into feeling worthless. Counselor encouraged patient to explore his "bad days", and patient identified that many of them happen when he is around his mother, and he has a lot of anxiety surrounding telling her about his diagnosis. Patient processed with counselor his fears of this. Counselor encouraged patient to focus on accepting the diagnosis himself and lean on HIV+ friend who has an undetectable viral load for support, so he can feel more self-confident when/if he decides to tell his mother. Counselor educated patient on self-statements, and guided him in reframing statements such as "I have to take this medication for the rest of my life," to"I'm choosing to take my medications because that is how I take care of myself and get power over this virus."   Patient may benefit from weekly supportive therapy and CBT.  Marland Kitchen  PLAN: 1. Follow up with behavioral health clinician on :09/22/18  [facility]

## 2018-09-22 ENCOUNTER — Institutional Professional Consult (permissible substitution): Payer: Medicaid Other | Admitting: Licensed Clinical Social Worker

## 2018-09-22 ENCOUNTER — Other Ambulatory Visit: Payer: Medicaid Other

## 2018-09-23 ENCOUNTER — Institutional Professional Consult (permissible substitution): Payer: Medicaid Other | Admitting: Licensed Clinical Social Worker

## 2018-09-23 ENCOUNTER — Other Ambulatory Visit: Payer: Medicaid Other

## 2018-10-07 MED FILL — BIKTARVY 50-200-25 MG TABS: 50-200-25 | 30 days supply | Qty: 30 | Fill #2

## 2018-10-10 ENCOUNTER — Ambulatory Visit (INDEPENDENT_AMBULATORY_CARE_PROVIDER_SITE_OTHER): Payer: Self-pay | Admitting: Licensed Clinical Social Worker

## 2018-10-10 ENCOUNTER — Other Ambulatory Visit: Payer: Medicaid Other

## 2018-10-10 ENCOUNTER — Other Ambulatory Visit: Payer: Self-pay

## 2018-10-10 DIAGNOSIS — F4323 Adjustment disorder with mixed anxiety and depressed mood: Secondary | ICD-10-CM

## 2018-10-10 DIAGNOSIS — B20 Human immunodeficiency virus [HIV] disease: Secondary | ICD-10-CM

## 2018-10-10 NOTE — BH Specialist Note (Signed)
Integrated Behavioral Health Visit via Telemedicine (Telephone)  10/10/2018 Kenneth Heath [id number]   Session Start time: 11:00am   Session End time: 11:30am Total time: 30 minutes Type of Visit: Telephonic Patient location: Patient's home [facility] Provider location: Provider's home office All persons participating in visit: Counselor and Patient  Confirmed patient's address: Yes  Confirmed patient's phone number: Yes  Any changes to demographics: No   Confirmed patient's insurance: Yes  Any changes to patient's insurance: No   Discussed confidentiality: Yes   The following statements were read to the patient and/or legal guardian that are established with the [facility] Provider.  "The purpose of this phone visit is to provide behavioral health care while limiting exposure to the coronavirus (COVID19).  There is a possibility of technology failure and discussed alternative modes of communication if that failure occurs."  "By engaging in this telephone visit, you consent to the provision of healthcare.  Additionally, you authorize for your insurance to be billed for the services provided during this telephone visit."   Patient and/or legal guardian consented to telephone visit: Yes   PRESENTING CONCERNS: Patient and/or family reports the following symptoms/concerns: anxiety, negative thoughts about self, feelings of guilt, low motivation Duration of problem: 6 weeks; Severity of problem: moderate  STRENGTHS (Protective Factors/Coping Skills): Positive attitude, Open to change, Supportive friends, Spirituality  GOALS ADDRESSED: Patient will: 1.  Demonstrate ability to: Increase healthy adjustment to current life circumstances   INTERVENTIONS: Interventions utilized:  Brief CBT and Supportive Counseling  ASSESSMENT: Patient currently experiencing; anxiety, negative thought patterns regarding himself, guilty feelings, and little motivation. He reports that he has been  trying to think of his diagnosis not as something awful that has happened to him, but as an opportunity to make some changes that will improve the healthiness of his life. Counselor commended patient for this, and explained to him that this is a type of cognitive restructuring. Counselor educated patient on the cognitive triangle. Patient and counselor explored ways that patient can address his feelings through cognitive reframing/restructuring and behavioral change. Patient indicated that he has concerns about his feelings for his past partner; he spends a lot of time thinking anxious and self-deprecating thoughts about the end of the relationship. Counselor processed with patient his thoughts about relationship, which patient identified as a lot of "should" statements including that he should not have feelings for ex anymore, after everything that has happened. Counselor explored with patient ways to reframe "should" statements to be more active and empowering. Patient discussed his desire to reach out to ex-boyfriend. Counselor emphasized that patient base this on what his purpose in doing so would be. Counselor guided patient to explore whether he is in a place that he can accept whatever reaction ex has, rather than basing his own emotions on ex's reaction. Patient and counselor explored ways to determine this.   Patient may benefit from counseling sessions via phone every 2 weeks.  PLAN: 1. Follow up with behavioral health clinician on : 11/03/18  [facility]

## 2018-10-11 LAB — T-HELPER CELL (CD4) - (RCID CLINIC ONLY)
CD4 % Helper T Cell: 18 % — ABNORMAL LOW (ref 33–65)
CD4 T Cell Abs: 358 /uL — ABNORMAL LOW (ref 400–1790)

## 2018-10-11 NOTE — Progress Notes (Signed)
Looking good!

## 2018-10-13 LAB — HIV-1 RNA QUANT-NO REFLEX-BLD
HIV 1 RNA Quant: 117 copies/mL — ABNORMAL HIGH
HIV-1 RNA Quant, Log: 2.07 Log copies/mL — ABNORMAL HIGH

## 2018-11-01 MED FILL — BIKTARVY 50-200-25 MG TABS: 50-200-25 | 30 days supply | Qty: 30 | Fill #3

## 2018-11-03 ENCOUNTER — Ambulatory Visit: Payer: Medicaid Other | Admitting: Licensed Clinical Social Worker

## 2018-11-03 ENCOUNTER — Other Ambulatory Visit: Payer: Self-pay

## 2018-11-03 ENCOUNTER — Other Ambulatory Visit: Payer: Self-pay | Admitting: Infectious Diseases

## 2018-11-03 ENCOUNTER — Ambulatory Visit (INDEPENDENT_AMBULATORY_CARE_PROVIDER_SITE_OTHER): Payer: Self-pay | Admitting: Infectious Diseases

## 2018-11-03 DIAGNOSIS — B20 Human immunodeficiency virus [HIV] disease: Secondary | ICD-10-CM

## 2018-11-03 DIAGNOSIS — Z0000 Encounter for general adult medical examination without abnormal findings: Secondary | ICD-10-CM

## 2018-11-03 DIAGNOSIS — F4323 Adjustment disorder with mixed anxiety and depressed mood: Secondary | ICD-10-CM

## 2018-11-03 DIAGNOSIS — Z21 Asymptomatic human immunodeficiency virus [HIV] infection status: Secondary | ICD-10-CM

## 2018-11-04 ENCOUNTER — Telehealth: Payer: Self-pay | Admitting: Licensed Clinical Social Worker

## 2018-11-09 ENCOUNTER — Encounter: Payer: Self-pay | Admitting: Infectious Diseases

## 2018-11-09 NOTE — Assessment & Plan Note (Signed)
He is showing good signs of improvement.  We will continue to monitor this and offer counseling sessions with our team.

## 2018-11-09 NOTE — Assessment & Plan Note (Addendum)
He has had a rapid increase in his CD4 count over the first month of therapy.  From what I can tell there is no symptoms that would suggest anything concerning from an IRIS perspective.  I suspect this might decrease a little bit and then slowly rebuild thereafter.  I asked him to continue taking his Bactrim for 2 more months and then he can stop.

## 2018-11-09 NOTE — Assessment & Plan Note (Addendum)
We discussed the natural progression of recovery and maintenance for his HIV disease.  His viral load is much improved.  I would expect to him to be undetectable within the next few weeks.  Adherence counseling provided today.  Congratulated on his good efforts. Will offer condoms and STI testing upon next office visit. We will start his hepatitis B vaccines and administer Prevnar at upcoming office appointment as well. We will need to repeat his QuantiFERON gold assay as it was indeterminate in the setting of severely compromised immune system.

## 2018-11-09 NOTE — Assessment & Plan Note (Signed)
Prevnar and hepatitis B vaccines to start at upcoming office visit once were sure his CD4 count is above 200.

## 2018-11-09 NOTE — Progress Notes (Signed)
Name: Kenneth Heath  MPN:[id number]   DOB: 03-Aug-1995   PCP: Patient, No Pcp Per   Virtual Visit via Telephone Note  I connected with Kenneth Heath on 11/09/18 at 10:00 AM EDT by telephone and verified that I am speaking with the correct person using two identifiers.   I discussed the limitations, risks, security and privacy concerns of performing an evaluation and management service by telephone and the availability of in person appointments. I also discussed with the patient that there may be a patient responsible charge related to this service. The patient expressed understanding and agreed to proceed.   Chief Complaint  Patient presents with  . Follow-up     History of Present Illness: Kenneth Heath is a 23 y.o. male with HIV, Dx 07/2018. CD4 nadir 60.  HIV Risk: MSM.  History of OIs: none Hep B sAg (-), Hep B sAb (-) / cAb (-), Hep A (+), Hep C (-), HLA B*5701 (-), Quantiferon (Indeterminant)   Previous Regimens:  Biktarvy   Genotypes:  07/2018: no PI/RTI mutations    Kenneth Heath is feeling much better.  He reports excellent energy and appetite.  He feels like he is much more supported with regards to his diagnosis both from personal education and environmental/family support.  He has had several visits with Kenneth Heath over the phone for support as well and working through his adjustment period.  He has missed no doses of his Biktarvy and relies on his phone reminder to ensure he takes this every day.  He has had no trouble with any side effects or access to his medication.  He has not been sexually active since her last office visit.  Currently at home abiding by quarantine regulations.  He is not been around any sick contacts.  He has stopped his Bactrim thinking he was done with that medication.  Medical/surgical/social/family history have been updated during today's visit.    Observations/Objective: Kenneth Heath sounds to be in a very good mood on the phone today.  He is  upbeat, cheerful, and very optimistic with a good outlook on his future.  HIV 1 RNA Quant (copies/mL)  Date Value  10/10/2018 117 (H)  08/11/2018 528,000 (H)   CD4 T Cell Abs (/uL)  Date Value  10/10/2018 358 (L)  08/11/2018 60 (L)    Lab Results  Component Value Date   CREATININE 0.94 08/11/2018    Lab Results  Component Value Date   WBC 8.7 08/11/2018   HGB 14.8 08/11/2018   HCT 44.5 08/11/2018   MCV 86.7 08/11/2018   PLT 304 08/11/2018    Lab Results  Component Value Date   ALT 28 08/11/2018   AST 31 08/11/2018   BILITOT 0.7 08/11/2018     Assessment and Plan: Problem List Items Addressed This Visit      Unprioritized   Adjustment disorder with mixed anxiety and depressed mood    He is showing good signs of improvement.  We will continue to monitor this and offer counseling sessions with our team.      AIDS (acquired immune deficiency syndrome) ([place])    He has had a rapid increase in his CD4 count over the first month of therapy.  From what I can tell there is no symptoms that would suggest anything concerning from an IRIS perspective.  I suspect this might decrease a little bit and then slowly rebuild thereafter.  I asked him to continue taking his Bactrim for 2 more months and then he can  stop.      Healthcare maintenance    Prevnar and hepatitis B vaccines to start at upcoming office visit once were sure his CD4 count is above 200.       HIV (human immunodeficiency virus infection) ([place])    We discussed the natural progression of recovery and maintenance for his HIV disease.  His viral load is much improved.  I would expect to him to be undetectable within the next few weeks.  Adherence counseling provided today.  Congratulated on his good efforts. Will offer condoms and STI testing upon next office visit. We will start his hepatitis B vaccines and administer Prevnar at upcoming office appointment as well. We will need to repeat his QuantiFERON gold  assay as it was indeterminate in the setting of severely compromised immune system.          Follow Up Instructions: He will return to see me in the office in 3 months.  We can do lab work at the visit.  I discussed the assessment and treatment plan with the patient. The patient was provided an opportunity to ask questions and all were answered. The patient agreed with the plan and demonstrated an understanding of the instructions.   The patient was advised to call back or seek an in-person evaluation if the symptoms worsen or if the condition fails to improve as anticipated.  I provided 11 minutes of non-face-to-face time during this encounter.   Kenneth Madeira, MSN, NP-C [facility] [email or web address] Pager: [phone] Office: [phone] [place]: [phone]

## 2018-11-09 NOTE — Patient Instructions (Addendum)
It was very nice to talk to you on the phone today.  I am glad to hear that you are staying well and keeping healthy.    Your medication is working perfectly for you and you are doing a great job taking it as prescribed  Please continue your Biktarvy and Bactrim once daily as we discussed.  You can plan on stopping your Bactrim in late July.   Vaccines Recommended:   Pneumonia and hepatitis B  Recommended Next Office Visit:   3 months.  We will obtain lab work at this visit.  I have scheduled you for an office visit already.  Please be here 30 minutes early to meet with financial coordinator to re-enroll for your HIV insurance program so we can continue giving you care and medications.  Please bring to recent pace stubs with you to this appointment.  Please continue to be well, stay away from folks that are sick, wash her hands frequently, do not touch her face or mouth and continue to take your medications.

## 2018-11-28 MED FILL — BIKTARVY 50-200-25 MG TABS: 50-200-25 | 30 days supply | Qty: 30 | Fill #4

## 2018-12-29 ENCOUNTER — Telehealth: Payer: Self-pay | Admitting: Pharmacy Technician

## 2018-12-29 ENCOUNTER — Other Ambulatory Visit: Payer: Self-pay | Admitting: Pharmacist

## 2018-12-29 DIAGNOSIS — B20 Human immunodeficiency virus [HIV] disease: Secondary | ICD-10-CM

## 2018-12-29 MED ORDER — BIKTARVY 50-200-25 MG PO TABS: 1.0000 | ORAL_TABLET | Freq: Every day | ORAL | 11 refills | Status: DC

## 2018-12-29 NOTE — Progress Notes (Signed)
Sending Biktarvy to [facility] as patient is HMAP approved.

## 2018-12-29 NOTE — Telephone Encounter (Signed)
[facility] Patient Advocate Encounter  Patient is HMAP approved and it is requiring that we transfer their Kenneth Heath prescription to [place] on [address].  I left a voicemail asking the patient to call so that I can explain.  Venida Jarvis. Nadara Mustard [place] Patient [facility] Phone: [phone] Fax:  [phone]

## 2019-01-05 ENCOUNTER — Ambulatory Visit: Payer: Medicaid Other | Admitting: Infectious Diseases

## 2019-01-05 ENCOUNTER — Ambulatory Visit: Payer: Medicaid Other

## 2019-01-26 ENCOUNTER — Ambulatory Visit: Payer: Medicaid Other | Admitting: Infectious Diseases

## 2019-01-26 ENCOUNTER — Ambulatory Visit: Payer: Medicaid Other

## 2019-04-13 ENCOUNTER — Encounter: Payer: Self-pay | Admitting: Infectious Diseases

## 2019-05-15 IMAGING — DX DG CHEST 2V
2 series · 2 of 2 positions shown · non-contrast
Comparison: July 2018

CLINICAL DATA: Shortness of breath.  Recent pneumonia

EXAM:
CHEST - 2 VIEW

[chest pa]
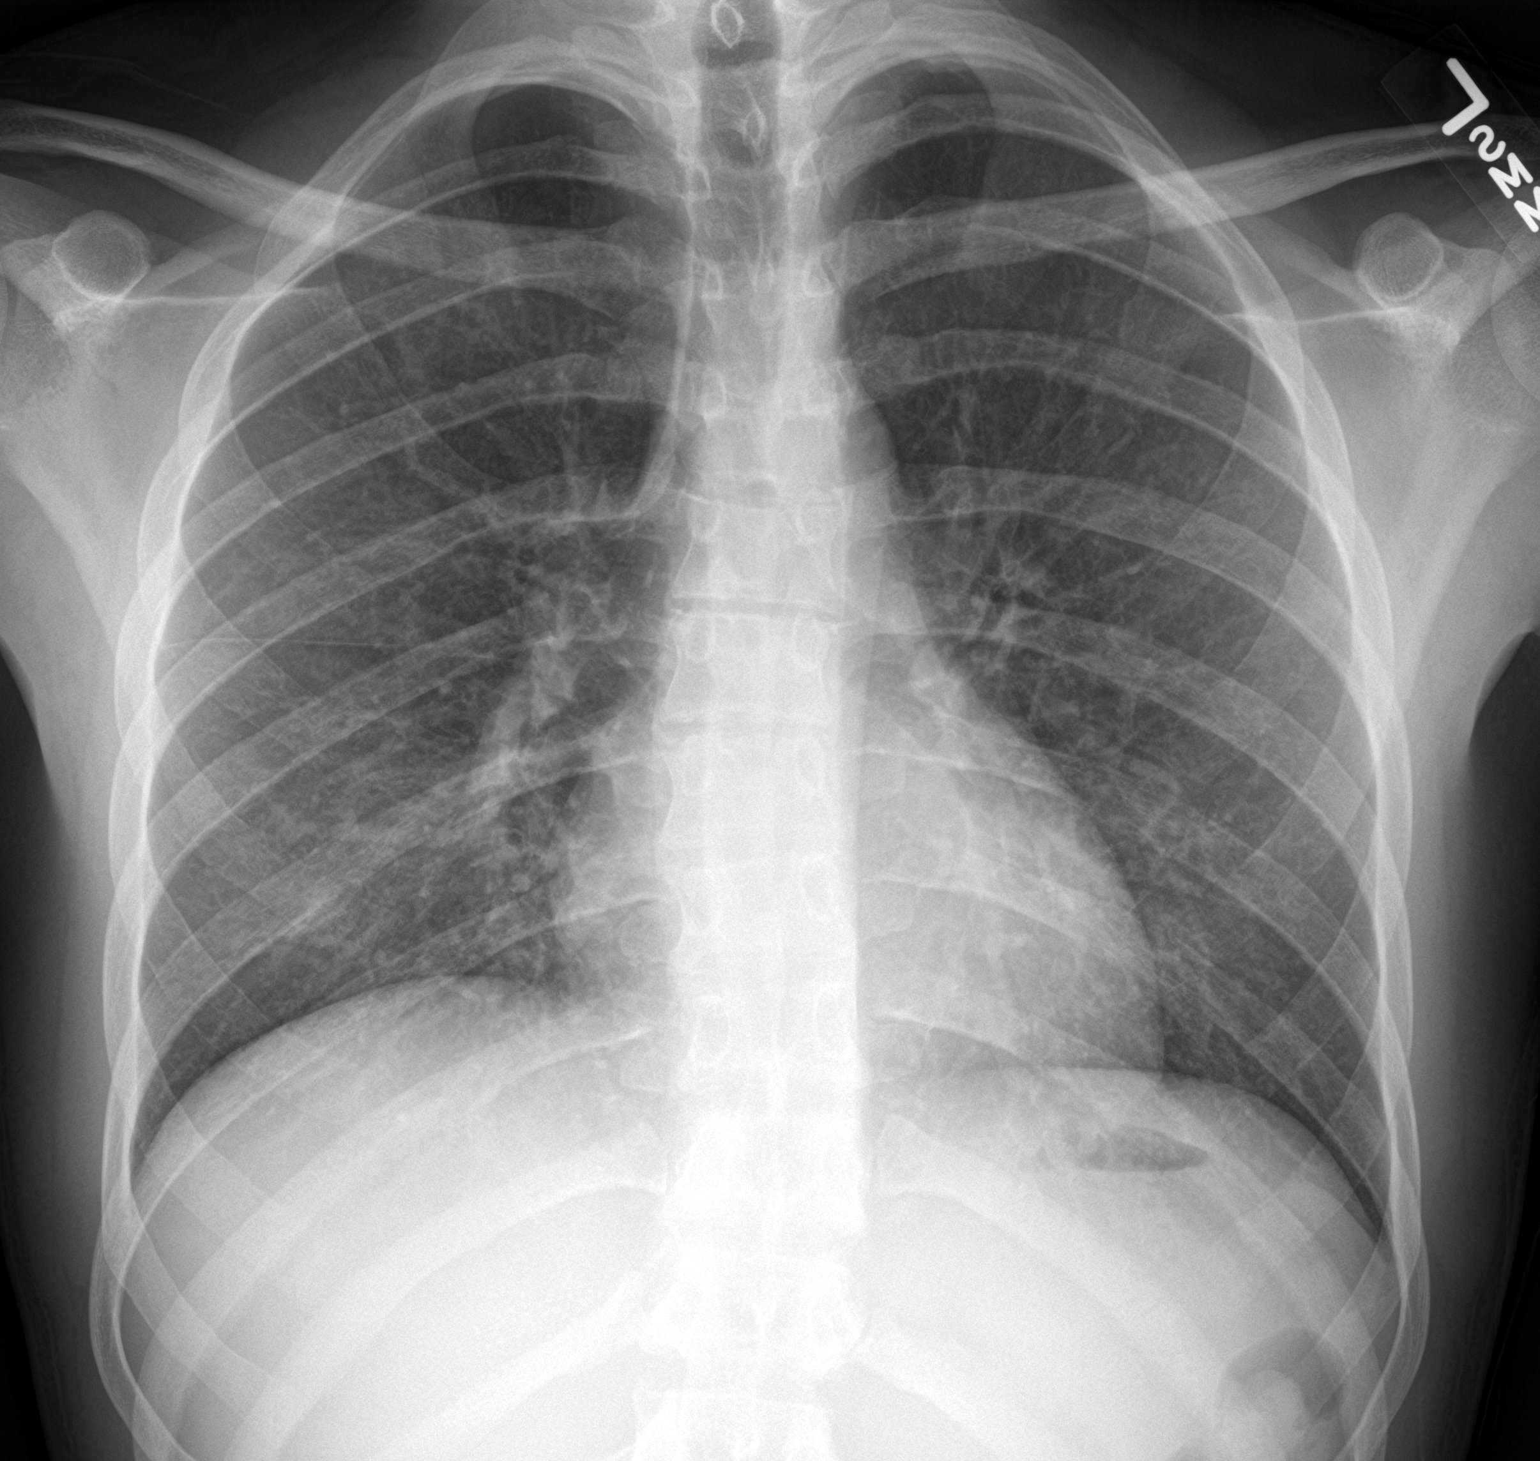

[chest lat]
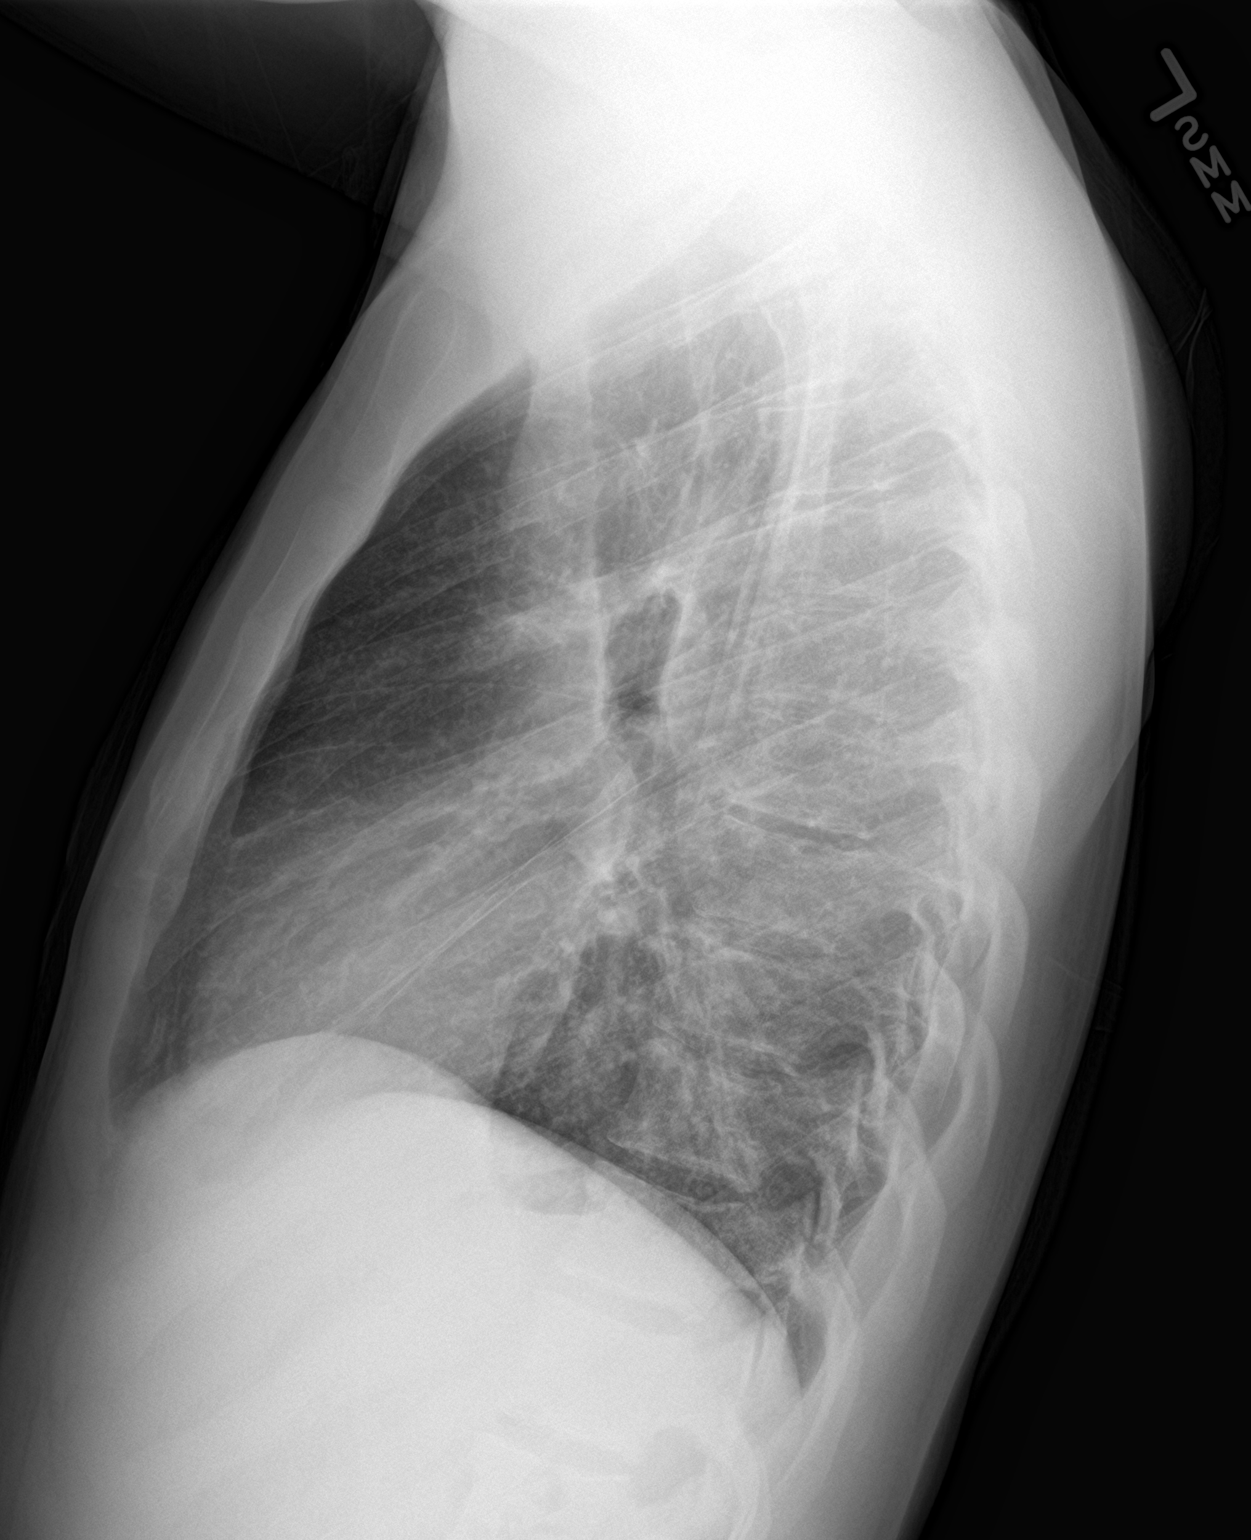

[2 of 2 positions shown; findings below may reference images not displayed]

FINDINGS: There has been near complete clearing of infiltrate from the right
base. Slight atelectasis remains in this area. Lungs elsewhere are
clear. Heart size and pulmonary vascularity normal. No adenopathy.
No bone lesions.
IMPRESSION: Complete clearing of infiltrate from the right base. Slight residual
atelectasis in this area. Lungs elsewhere clear. No adenopathy.

## 2019-10-04 ENCOUNTER — Encounter: Payer: Self-pay | Admitting: Infectious Diseases

## 2019-10-12 ENCOUNTER — Ambulatory Visit (INDEPENDENT_AMBULATORY_CARE_PROVIDER_SITE_OTHER): Payer: Self-pay | Admitting: Infectious Diseases

## 2019-10-12 ENCOUNTER — Other Ambulatory Visit: Payer: Self-pay

## 2019-10-12 ENCOUNTER — Encounter: Payer: Self-pay | Admitting: Infectious Diseases

## 2019-10-12 VITALS — BP 148/98 | HR 102 | Temp 98.4°F | Wt 188.0 lb

## 2019-10-12 DIAGNOSIS — B20 Human immunodeficiency virus [HIV] disease: Secondary | ICD-10-CM

## 2019-10-12 DIAGNOSIS — Z23 Encounter for immunization: Secondary | ICD-10-CM

## 2019-10-12 DIAGNOSIS — Z0000 Encounter for general adult medical examination without abnormal findings: Secondary | ICD-10-CM

## 2019-10-12 DIAGNOSIS — Z21 Asymptomatic human immunodeficiency virus [HIV] infection status: Secondary | ICD-10-CM

## 2019-10-12 NOTE — Patient Instructions (Addendum)
Please continue your Biktarvy every day at the same time   Please keep a check on your weight - once a month at least. We can consider changing your medication if you find you continue to gain steadily. There is a real possibility it is due to the [place].   Please stop by the lab on your way out for blood work and urine sample (if you can)  Please come back in 3 months.    Over the Counter Medications:  Decongestants - medications that help to helps relieve congestion ("stuffy nose")   Flonase (generic fluticasone) or Nasacort (generic triamcinolone) - this takes at least 1 week to work but would recommend especially for those that have allergies  Sudafed (generic pseudoephedrine - Note this is the one that is available behind the pharmacy counter)  Products with "-D" on the label mean they already contain pseudoephedrine (Mucinex-D or Zyrtec-D, for example).   Allergie Medicine - relieves runny nose, itchy/watering eyes and sneezing   Claritin (generic loratidine), Allegra (fexofenidine), or Zyrtec (generic cyrterizine) for runny nose. These medications should not cause you to be sleepy.  Benadryl (generic diphenhydramine) may also be used if symptoms are severe - this likely will cause drowsiness/tiredness/foggy head so try to use only at night.   Allergies happen all year round not just over the spring - many people are troubled in the winter season as well.  Keep windows closed, rotate air filters regularly, reduce exposure to any triggers (pet hair, dust, weeds/grasses, etc)  Cough -   Delsym or Robitussin (generic dextromethorphan)  Products with "-DM" contain dextromethorphan ingredient for cough (Mucinex-DM, for example)  Honey, straight-up  Lemon water  Hot tea   Ginger   Vics Vapor Rub

## 2019-10-12 NOTE — Assessment & Plan Note (Signed)
Heplisav vaccine #1 today.

## 2019-10-12 NOTE — Progress Notes (Signed)
Name: Kenneth Heath  DOB: 31-Mar-1996 MRN: [id number] PCP: Patient, No Pcp Per    Patient Active Problem List   Diagnosis Date Noted  . Healthcare maintenance 08/12/2018  . HIV (human immunodeficiency virus infection) ([place]) 08/10/2018     Brief Narrative:  Kenneth Heath is a 24 y.o. male with HIV, Dx 07/2018. CD4 nadir 60.  HIV Risk: MSM.  History of OIs: none Hep B sAg (-), Hep B sAb (-) / cAb (-), Hep A (+), Hep C (-), HLA B*5701 (-), Quantiferon (Indeterminant)   Previous Regimens:  Biktarvy   Genotypes:  07/2018: no PI/RTI mutations    Subjective:   Chief Complaint  Patient presents with  . Follow-up  Weight gain (~40 lbs in the last year) Allergies and what can he do to help    HPI: Kenneth Heath has been doing well since our last visit together. He has stayed healthy and out of the hospital. No issues with acute illnesses either. He is working remotely from home. He is not exercising formally at all. Eating liberally and does describe him to be a frequent snacker; has had moments where he has just not been able to feel full. He noticed that his weight was stable around 5-6 months after starting Biktarvy but then increased to 170 and over the last 3 months up another nearly 20 lbs.   He otherwise has tolerated the biktarvy everyday and takes it every evening around midnight before he goes to bed. No sexual partners since diagnosis.   Medical/surgical/social/family history have been updated during today's visit.  No difficulty with sleep and no concern over anxious or depressed mood.    Review of Systems  Constitutional: Positive for unexpected weight change. Negative for appetite change, chills, fatigue and fever.  Eyes: Negative for visual disturbance.  Respiratory: Negative for cough and shortness of breath.   Cardiovascular: Negative for chest pain and leg swelling.  Gastrointestinal: Negative for abdominal pain, diarrhea and nausea.  Genitourinary:  Negative for discharge, dysuria and genital sores.  Musculoskeletal: Negative for joint swelling.  Skin: Negative for color change and rash.  Neurological: Negative for dizziness and headaches.  Hematological: Negative for adenopathy.  Psychiatric/Behavioral: Negative for sleep disturbance. The patient is not nervous/anxious.      Past Medical History:  Diagnosis Date  . Asthma   . Pneumonia     Outpatient Medications Prior to Visit  Medication Sig Dispense Refill  . albuterol (PROVENTIL HFA;VENTOLIN HFA) 108 (90 Base) MCG/ACT inhaler Inhale 2 puffs into the lungs every 4 (four) hours as needed for wheezing or shortness of breath (cough, shortness of breath or wheezing.). 1 Inhaler 2  . albuterol (PROVENTIL) (2.5 MG/3ML) 0.083% nebulizer solution Take 3 mLs (2.5 mg total) by nebulization every 6 (six) hours as needed for wheezing or shortness of breath. 75 mL 12  . bictegravir-emtricitabine-tenofovir AF (BIKTARVY) 50-200-25 MG TABS tablet Take 1 tablet by mouth daily. 30 tablet 11  . Cetirizine HCl (ZYRTEC PO) Take by mouth.    . fluticasone (FLONASE) 50 MCG/ACT nasal spray Place 1 spray into both nostrils daily. (Patient not taking: Reported on 10/12/2019) 15.8 g 2  . benzonatate (TESSALON) 100 MG capsule Take 1 capsule by mouth every 8 (eight) hours for cough. (Patient not taking: Reported on 08/11/2018) 21 capsule 0  . sulfamethoxazole-trimethoprim (BACTRIM DS) 800-160 MG tablet Take 1 tablet by mouth once daily (Patient not taking: Reported on 10/12/2019) 30 tablet 0   No facility-administered medications prior to visit.  No Known Allergies  Social History   Tobacco Use  . Smoking status: Never Smoker  . Smokeless tobacco: Never Used  Substance Use Topics  . Alcohol use: No    Alcohol/week: 0.0 standard drinks  . Drug use: No    Family History  Problem Relation Age of Onset  . Cancer Neg Hx   . Diabetes Neg Hx   . Heart disease Neg Hx     Social History   Substance  and Sexual Activity  Sexual Activity Not on file     Objective:   Vitals:   10/12/19 1022  BP: (!) 148/98  Pulse: (!) 102  Temp: 98.4 F (36.9 C)  TempSrc: Oral  Weight: 188 lb (85.3 kg)   Body mass index is 30.34 kg/m.  Physical Exam HENT:     Mouth/Throat:     Mouth: No oral lesions.     Dentition: Normal dentition. No dental caries.  Eyes:     General: No scleral icterus. Cardiovascular:     Rate and Rhythm: Normal rate and regular rhythm.     Heart sounds: Normal heart sounds.  Pulmonary:     Effort: Pulmonary effort is normal.     Breath sounds: Normal breath sounds.  Abdominal:     General: There is no distension.     Palpations: Abdomen is soft.     Tenderness: There is no abdominal tenderness.  Lymphadenopathy:     Cervical: No cervical adenopathy.  Skin:    General: Skin is warm and dry.     Findings: No rash.  Neurological:     Mental Status: He is alert and oriented to person, place, and time.     Lab Results Lab Results  Component Value Date   WBC 8.7 08/11/2018   HGB 14.8 08/11/2018   HCT 44.5 08/11/2018   MCV 86.7 08/11/2018   PLT 304 08/11/2018    Lab Results  Component Value Date   CREATININE 0.94 08/11/2018   BUN 14 08/11/2018   NA 133 (L) 08/11/2018   K 4.4 08/11/2018   CL 96 (L) 08/11/2018   CO2 28 08/11/2018    Lab Results  Component Value Date   ALT 28 08/11/2018   AST 31 08/11/2018   BILITOT 0.7 08/11/2018    Lab Results  Component Value Date   CHOL 207 (H) 08/11/2018   HDL 40 08/11/2018   LDLCALC 143 (H) 08/11/2018   TRIG 122 08/11/2018   CHOLHDL 5.2 (H) 08/11/2018   HIV 1 RNA Quant (copies/mL)  Date Value  10/10/2018 117 (H)  08/11/2018 528,000 (H)   CD4 T Cell Abs (/uL)  Date Value  10/10/2018 358 (L)  08/11/2018 60 (L)     Assessment & Plan:   Problem List Items Addressed This Visit      Unprioritized   HIV (human immunodeficiency virus infection) ([place]) - Primary (Chronic)    We reviewed his labs  at entry to care and most recent values. He is due for updated labs today to ensure he is undetectable and re-assess CD4. He had a rapid rise > 300 cells in 6 weeks on therapy so I suspect he will still be in a normal range today.  Seems to be overall doing well. Weight gain was discussed and could be due to [facility] for sure. He will continue this option for HIV treatment at this time and continue to monitor his weight. Alt regimen would be Pifeltro + Descovy.  RTC in 3 months for  repeat blood work.       Relevant Orders   HIV-1 RNA quant-no reflex-bld   T-helper cell (CD4)- ([facility] clinic only)   RPR   COMPLETE METABOLIC PANEL WITH GFR   CBC with Differential/Platelet   Heplisav-B (HepB-CPG) Vaccine (Completed)   Healthcare maintenance    Heplisav vaccine #1 today.        Other Visit Diagnoses    HIV disease ([place])       Need for hepatitis B vaccination       Relevant Orders   Heplisav-B (HepB-CPG) Vaccine (Completed)      Janene Madeira, MSN, NP-C [facility] [place] Pager: [phone] Office: [phone]  10/12/19  3:23 PM

## 2019-10-12 NOTE — Assessment & Plan Note (Signed)
We reviewed his labs at entry to care and most recent values. He is due for updated labs today to ensure he is undetectable and re-assess CD4. He had a rapid rise > 300 cells in 6 weeks on therapy so I suspect he will still be in a normal range today.  Seems to be overall doing well. Weight gain was discussed and could be due to [facility] for sure. He will continue this option for HIV treatment at this time and continue to monitor his weight. Alt regimen would be Pifeltro + Descovy.  RTC in 3 months for repeat blood work.

## 2019-10-13 ENCOUNTER — Other Ambulatory Visit: Payer: Self-pay | Admitting: *Deleted

## 2019-10-13 DIAGNOSIS — J45909 Unspecified asthma, uncomplicated: Secondary | ICD-10-CM

## 2019-10-13 LAB — T-HELPER CELL (CD4) - (RCID CLINIC ONLY)
CD4 % Helper T Cell: 19 % — ABNORMAL LOW (ref 33–65)
CD4 T Cell Abs: 317 /uL — ABNORMAL LOW (ref 400–1790)

## 2019-10-13 MED ORDER — ALBUTEROL SULFATE HFA 108 (90 BASE) MCG/ACT IN AERS: 2.0000 | INHALATION_SPRAY | RESPIRATORY_TRACT | 3 refills | Status: DC | PRN

## 2019-10-14 LAB — CBC WITH DIFFERENTIAL/PLATELET
Absolute Monocytes: 528 cells/uL (ref 200–950)
Basophils Absolute: 39 cells/uL (ref 0–200)
Basophils Relative: 0.7 %
Eosinophils Absolute: 468 cells/uL (ref 15–500)
Eosinophils Relative: 8.5 %
HCT: 45.1 % (ref 38.5–50.0)
Hemoglobin: 15.8 g/dL (ref 13.2–17.1)
Lymphs Abs: 1799 cells/uL (ref 850–3900)
MCH: 32.3 pg (ref 27.0–33.0)
MCHC: 35 g/dL (ref 32.0–36.0)
MCV: 92.2 fL (ref 80.0–100.0)
MPV: 9.8 fL (ref 7.5–12.5)
Monocytes Relative: 9.6 %
Neutro Abs: 2668 cells/uL (ref 1500–7800)
Neutrophils Relative %: 48.5 %
Platelets: 202 10*3/uL (ref 140–400)
RBC: 4.89 10*6/uL (ref 4.20–5.80)
RDW: 12.9 % (ref 11.0–15.0)
Total Lymphocyte: 32.7 %
WBC: 5.5 10*3/uL (ref 3.8–10.8)

## 2019-10-14 LAB — HIV-1 RNA QUANT-NO REFLEX-BLD
HIV 1 RNA Quant: <20 copies/mL — AB
HIV-1 RNA Quant, Log: <1.3 Log copies/mL — AB

## 2019-10-14 LAB — COMPLETE METABOLIC PANEL WITH GFR
AG Ratio: 1.5 (calc) (ref 1.0–2.5)
ALT: 13 U/L (ref 9–46)
AST: 16 U/L (ref 10–40)
Albumin: 4.4 g/dL (ref 3.6–5.1)
Alkaline phosphatase (APISO): 67 U/L (ref 36–130)
BUN: 13 mg/dL (ref 7–25)
CO2: 27 mmol/L (ref 20–32)
Calcium: 9.5 mg/dL (ref 8.6–10.3)
Chloride: 104 mmol/L (ref 98–110)
Creat: 1.29 mg/dL (ref 0.60–1.35)
GFR, Est African American: 91 mL/min/{1.73_m2} (ref >=60)
GFR, Est Non African American: 78 mL/min/{1.73_m2} (ref >=60)
Globulin: 2.9 g/dL (calc) (ref 1.9–3.7)
Glucose, Bld: 100 mg/dL — ABNORMAL HIGH (ref 65–99)
Potassium: 4 mmol/L (ref 3.5–5.3)
Sodium: 139 mmol/L (ref 135–146)
Total Bilirubin: 0.5 mg/dL (ref 0.2–1.2)
Total Protein: 7.3 g/dL (ref 6.1–8.1)

## 2019-10-14 LAB — RPR: RPR Ser Ql: NONREACTIVE

## 2020-03-02 ENCOUNTER — Other Ambulatory Visit: Payer: Self-pay | Admitting: Pharmacist

## 2020-03-02 DIAGNOSIS — B20 Human immunodeficiency virus [HIV] disease: Secondary | ICD-10-CM

## 2020-04-08 ENCOUNTER — Telehealth: Payer: Self-pay | Admitting: *Deleted

## 2020-04-08 ENCOUNTER — Ambulatory Visit: Payer: Self-pay

## 2020-04-08 ENCOUNTER — Telehealth: Payer: Self-pay

## 2020-04-08 ENCOUNTER — Other Ambulatory Visit: Payer: Self-pay | Admitting: Infectious Diseases

## 2020-04-08 DIAGNOSIS — B20 Human immunodeficiency virus [HIV] disease: Secondary | ICD-10-CM

## 2020-04-08 MED ORDER — BIKTARVY 50-200-25 MG PO TABS: 1.0000 | ORAL_TABLET | Freq: Every day | ORAL | 1 refills | Status: DC

## 2020-04-08 NOTE — Telephone Encounter (Signed)
Received refill request for biktarvy. Patient overdue for visit, ADAP has expired.  Clinic reached out 9/28 to get patient in for appointment, renewal.  He did not view mychart message. RN denied refill, asking pharmacy to have patient contact provider. RN called patient.  Call went to voicemail that is not yet set up. RN will send mychart message to attempt to reach [place]. Andree Coss, RN

## 2020-04-08 NOTE — Telephone Encounter (Signed)
Patient called back, needs to renew adap over the phone, as he is at [employer] for the fall semester.  RN scheduled patient for phone visit with Roe Coombs this afternoon, for follow up with [facility] 12/15 at 4:00.  RN sent in prescription refill. Will need 1 month coverage from pharmacy. Andree Coss, RN

## 2020-04-08 NOTE — Telephone Encounter (Addendum)
[facility] Patient Advocate Encounter  Completed and sent [employer] Advancing Access application for Biktarvy for this patient who is uninsured.    Patient is approved 04/08/20 through 11/01/222.     Clearance Coots, CPhT Specialty Pharmacy Patient [facility] Phone: [phone] Fax:  [phone]

## 2020-05-22 ENCOUNTER — Other Ambulatory Visit: Payer: Self-pay | Admitting: Pharmacist

## 2020-05-22 ENCOUNTER — Other Ambulatory Visit: Payer: Self-pay

## 2020-05-22 ENCOUNTER — Ambulatory Visit: Payer: Medicaid Other | Admitting: Infectious Diseases

## 2020-05-22 DIAGNOSIS — B20 Human immunodeficiency virus [HIV] disease: Secondary | ICD-10-CM

## 2020-05-24 ENCOUNTER — Ambulatory Visit: Payer: Medicaid Other | Admitting: Infectious Diseases

## 2020-05-24 NOTE — Progress Notes (Deleted)
Name: Kenneth Heath  DOB: December 18, 1995 MRN: [id number] PCP: Patient, No Pcp Per    Patient Active Problem List   Diagnosis Date Noted  . Healthcare maintenance 08/12/2018  . HIV (human immunodeficiency virus infection) ([place]) 08/10/2018     Brief Narrative:  Kenneth Heath is a 24 y.o. male with HIV, Dx 07/2018. CD4 nadir 60.  HIV Risk: MSM.  History of OIs: none Hep B sAg (-), Hep B sAb (-) / cAb (-), Hep A (+), Hep C (-), HLA B*5701 (-), Quantiferon (Indeterminant)   Previous Regimens:  Biktarvy   Genotypes:  07/2018: no PI/RTI mutations    Subjective:   No chief complaint on file. Weight gain (~40 lbs in the last year) Allergies and what can he do to help    HPI: ***  Biktarvy   Flu shot   STI screen     Review of Systems   Past Medical History:  Diagnosis Date  . Asthma   . Pneumonia     Outpatient Medications Prior to Visit  Medication Sig Dispense Refill  . albuterol (PROVENTIL) (2.5 MG/3ML) 0.083% nebulizer solution Take 3 mLs (2.5 mg total) by nebulization every 6 (six) hours as needed for wheezing or shortness of breath. 75 mL 12  . albuterol (VENTOLIN HFA) 108 (90 Base) MCG/ACT inhaler Inhale 2 puffs into the lungs every 4 (four) hours as needed for wheezing or shortness of breath (cough, shortness of breath or wheezing.). 18 g 3  . BIKTARVY 50-200-25 MG TABS tablet TAKE 1 TABLET BY MOUTH EVERY DAY 30 tablet 0  . Cetirizine HCl (ZYRTEC PO) Take by mouth.    . fluticasone (FLONASE) 50 MCG/ACT nasal spray Place 1 spray into both nostrils daily. (Patient not taking: Reported on 10/12/2019) 15.8 g 2   No facility-administered medications prior to visit.     No Known Allergies  Social History   Tobacco Use  . Smoking status: Never Smoker  . Smokeless tobacco: Never Used  Substance Use Topics  . Alcohol use: No    Alcohol/week: 0.0 standard drinks  . Drug use: No    Social History   Substance and Sexual Activity  Sexual  Activity Not on file     Objective:   There were no vitals filed for this visit. There is no height or weight on file to calculate BMI.  Physical Exam  Lab Results Lab Results  Component Value Date   WBC 5.5 10/12/2019   HGB 15.8 10/12/2019   HCT 45.1 10/12/2019   MCV 92.2 10/12/2019   PLT 202 10/12/2019    Lab Results  Component Value Date   CREATININE 1.29 10/12/2019   BUN 13 10/12/2019   NA 139 10/12/2019   K 4.0 10/12/2019   CL 104 10/12/2019   CO2 27 10/12/2019    Lab Results  Component Value Date   ALT 13 10/12/2019   AST 16 10/12/2019   BILITOT 0.5 10/12/2019    Lab Results  Component Value Date   CHOL 207 (H) 08/11/2018   HDL 40 08/11/2018   LDLCALC 143 (H) 08/11/2018   TRIG 122 08/11/2018   CHOLHDL 5.2 (H) 08/11/2018   HIV 1 RNA Quant (copies/mL)  Date Value  10/12/2019 <20 DETECTED (A)  10/10/2018 117 (H)  08/11/2018 528,000 (H)   CD4 T Cell Abs (/uL)  Date Value  10/12/2019 317 (L)  10/10/2018 358 (L)  08/11/2018 60 (L)     Assessment & Plan:   Problem List Items Addressed This  Visit   None     Janene Madeira, MSN, NP-C [facility] for Infect[facility] [place] Pager: [phone] Office: [phone]  05/24/20  8:32 AM

## 2020-07-19 ENCOUNTER — Telehealth: Payer: Self-pay

## 2020-07-19 ENCOUNTER — Other Ambulatory Visit: Payer: Self-pay

## 2020-07-19 DIAGNOSIS — B20 Human immunodeficiency virus [HIV] disease: Secondary | ICD-10-CM

## 2020-07-19 DIAGNOSIS — Z113 Encounter for screening for infections with a predominantly sexual mode of transmission: Secondary | ICD-10-CM

## 2020-07-19 DIAGNOSIS — Z79899 Other long term (current) drug therapy: Secondary | ICD-10-CM

## 2020-07-19 MED ORDER — BIKTARVY 50-200-25 MG PO TABS: 1.0000 | ORAL_TABLET | Freq: Every day | ORAL | 0 refills | Status: DC

## 2020-07-19 NOTE — Telephone Encounter (Signed)
Patient called office today requesting appointments and refills on Biktarvy. Patient last refill on Biktarvy was 05/2020; has only missed 3 days of medication. States he has been "Stretching out medication". Advised patient not to do this.  Scheduled appointment for labs on 2/16at 10 and follow up 3/8 at 9. Patient understands he MUST keep appointments for future refills. Will forward call to Financial Counselor to renew ADAP.

## 2020-07-19 NOTE — Telephone Encounter (Signed)
Patient was transferred to me to completed his ADAP application. 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 Patient states he doesn't know what insurance plan he has. He also has PAF that was completed back in Nov and will expire Nov 2022. I made patient aware of this.  After discussing with triage on refills for him. He will get a 30 day refill but needs to keep all his appt. I called patient back and left a message for him to call the office back .Marland Kitchen Thank you

## 2020-07-24 ENCOUNTER — Other Ambulatory Visit: Payer: Self-pay

## 2020-08-06 ENCOUNTER — Ambulatory Visit: Payer: Self-pay | Admitting: Family

## 2020-08-06 ENCOUNTER — Telehealth: Payer: Self-pay

## 2020-08-06 NOTE — Telephone Encounter (Signed)
Patient called to reschedule missed appointment with Carver Fila, FNP. Scheduled appointment for patient to have labs done this Friday and follow up in two weeks. Patient understands if he needs to change follow up appointment to e- visit he will need to have labs done this week.  Patient is having scheduling issues due to work schedule.

## 2020-08-09 ENCOUNTER — Other Ambulatory Visit: Payer: Self-pay | Admitting: Infectious Diseases

## 2020-08-09 ENCOUNTER — Other Ambulatory Visit: Payer: Medicaid Other

## 2020-08-09 DIAGNOSIS — B20 Human immunodeficiency virus [HIV] disease: Secondary | ICD-10-CM

## 2020-08-30 ENCOUNTER — Ambulatory Visit: Payer: Medicaid Other | Admitting: Family

## 2020-09-13 ENCOUNTER — Other Ambulatory Visit: Payer: Self-pay | Admitting: Infectious Diseases

## 2020-09-13 ENCOUNTER — Other Ambulatory Visit: Payer: Self-pay

## 2020-09-13 DIAGNOSIS — B20 Human immunodeficiency virus [HIV] disease: Secondary | ICD-10-CM

## 2020-09-13 MED ORDER — BIKTARVY 50-200-25 MG PO TABS: 1.0000 | ORAL_TABLET | Freq: Every day | ORAL | 0 refills | Status: DC

## 2020-09-27 ENCOUNTER — Other Ambulatory Visit: Payer: BC Managed Care – PPO

## 2020-09-30 ENCOUNTER — Other Ambulatory Visit: Payer: BC Managed Care – PPO

## 2020-10-04 NOTE — Progress Notes (Deleted)
Name: Kenneth Heath  DOB: 07/28/1995 MRN: [id number] PCP: Patient, No Pcp Per (Inactive)      Brief Narrative:  Kenneth Heath is a 25 y.o. male with HIV, Dx 07/2018. CD4 nadir 60.  HIV Risk: MSM.  History of OIs: none Hep B sAg (-), Hep B sAb (-) / cAb (-), Hep A (+), Hep C (-), HLA B*5701 (-), Quantiferon (Indeterminant)   Previous Regimens:  Biktarvy   Genotypes:  07/2018: no PI/RTI mutations    Subjective:   No chief complaint on file. HIV follow up care.  LOV 10-2019.    HPI:    Medical/surgical/social/family history have been updated during today's visit.  No difficulty with sleep and no concern over anxious or depressed mood.  No visits noted for hospitalizations, ER or UC visits.    Review of Systems  Constitutional: Positive for unexpected weight change. Negative for appetite change, chills, fatigue and fever.  Eyes: Negative for visual disturbance.  Respiratory: Negative for cough and shortness of breath.   Cardiovascular: Negative for chest pain and leg swelling.  Gastrointestinal: Negative for abdominal pain, diarrhea and nausea.  Genitourinary: Negative for dysuria, genital sores and penile discharge.  Musculoskeletal: Negative for joint swelling.  Skin: Negative for color change and rash.  Neurological: Negative for dizziness and headaches.  Hematological: Negative for adenopathy.  Psychiatric/Behavioral: Negative for sleep disturbance. The patient is not nervous/anxious.      Past Medical History:  Diagnosis Date  . Asthma   . Pneumonia     Outpatient Medications Prior to Visit  Medication Sig Dispense Refill  . albuterol (PROVENTIL) (2.5 MG/3ML) 0.083% nebulizer solution Take 3 mLs (2.5 mg total) by nebulization every 6 (six) hours as needed for wheezing or shortness of breath. 75 mL 12  . albuterol (VENTOLIN HFA) 108 (90 Base) MCG/ACT inhaler Inhale 2 puffs into the lungs every 4 (four) hours as needed for wheezing or shortness  of breath (cough, shortness of breath or wheezing.). 18 g 3  . bictegravir-emtricitabine-tenofovir AF (BIKTARVY) 50-200-25 MG TABS tablet Take 1 tablet by mouth daily. 30 tablet 0  . Cetirizine HCl (ZYRTEC PO) Take by mouth.    . fluticasone (FLONASE) 50 MCG/ACT nasal spray Place 1 spray into both nostrils daily. (Patient not taking: Reported on 10/12/2019) 15.8 g 2   No facility-administered medications prior to visit.     No Known Allergies  Social History   Tobacco Use  . Smoking status: Never Smoker  . Smokeless tobacco: Never Used  Substance Use Topics  . Alcohol use: No    Alcohol/week: 0.0 standard drinks  . Drug use: No    Family History  Problem Relation Age of Onset  . Cancer Neg Hx   . Diabetes Neg Hx   . Heart disease Neg Hx     Social History   Substance and Sexual Activity  Sexual Activity Not on file     Objective:   There were no vitals filed for this visit. There is no height or weight on file to calculate BMI.  Physical Exam HENT:     Mouth/Throat:     Mouth: No oral lesions.     Dentition: Normal dentition. No dental caries.  Eyes:     General: No scleral icterus. Cardiovascular:     Rate and Rhythm: Normal rate and regular rhythm.     Heart sounds: Normal heart sounds.  Pulmonary:     Effort: Pulmonary effort is normal.     Breath sounds: Normal  breath sounds.  Abdominal:     General: There is no distension.     Palpations: Abdomen is soft.     Tenderness: There is no abdominal tenderness.  Lymphadenopathy:     Cervical: No cervical adenopathy.  Skin:    General: Skin is warm and dry.     Findings: No rash.  Neurological:     Mental Status: He is alert and oriented to person, place, and time.     Lab Results Lab Results  Component Value Date   WBC 5.5 10/12/2019   HGB 15.8 10/12/2019   HCT 45.1 10/12/2019   MCV 92.2 10/12/2019   PLT 202 10/12/2019    Lab Results  Component Value Date   CREATININE 1.29 10/12/2019   BUN 13  10/12/2019   NA 139 10/12/2019   K 4.0 10/12/2019   CL 104 10/12/2019   CO2 27 10/12/2019    Lab Results  Component Value Date   ALT 13 10/12/2019   AST 16 10/12/2019   BILITOT 0.5 10/12/2019    Lab Results  Component Value Date   CHOL 207 (H) 08/11/2018   HDL 40 08/11/2018   LDLCALC 143 (H) 08/11/2018   TRIG 122 08/11/2018   CHOLHDL 5.2 (H) 08/11/2018   HIV 1 RNA Quant (copies/mL)  Date Value  10/12/2019 <20 DETECTED (A)  10/10/2018 117 (H)  08/11/2018 528,000 (H)   CD4 T Cell Abs (/uL)  Date Value  10/12/2019 317 (L)  10/10/2018 358 (L)  08/11/2018 60 (L)     Assessment & Plan:   Problem List Items Addressed This Visit      Unprioritized   HIV (human immunodeficiency virus infection) ([place]) - Primary (Chronic)   Healthcare maintenance      Janene Madeira, MSN, NP-C [place] for [facility] [place] Pager: [phone] Office: [phone]  10/04/20  3:26 PM

## 2020-10-08 ENCOUNTER — Ambulatory Visit (INDEPENDENT_AMBULATORY_CARE_PROVIDER_SITE_OTHER): Payer: BC Managed Care – PPO | Admitting: Infectious Diseases

## 2020-10-08 ENCOUNTER — Ambulatory Visit: Payer: BC Managed Care – PPO | Admitting: Infectious Diseases

## 2020-10-08 ENCOUNTER — Other Ambulatory Visit: Payer: Self-pay

## 2020-10-08 ENCOUNTER — Encounter: Payer: Self-pay | Admitting: Infectious Diseases

## 2020-10-08 VITALS — BP 121/76 | HR 83 | Resp 16 | Ht 66.0 in | Wt 155.6 lb

## 2020-10-08 DIAGNOSIS — B20 Human immunodeficiency virus [HIV] disease: Secondary | ICD-10-CM

## 2020-10-08 DIAGNOSIS — Z0000 Encounter for general adult medical examination without abnormal findings: Secondary | ICD-10-CM

## 2020-10-08 DIAGNOSIS — R41840 Attention and concentration deficit: Secondary | ICD-10-CM

## 2020-10-08 DIAGNOSIS — Z79899 Other long term (current) drug therapy: Secondary | ICD-10-CM | POA: Diagnosis not present

## 2020-10-08 DIAGNOSIS — Z21 Asymptomatic human immunodeficiency virus [HIV] infection status: Secondary | ICD-10-CM

## 2020-10-08 DIAGNOSIS — Z113 Encounter for screening for infections with a predominantly sexual mode of transmission: Secondary | ICD-10-CM

## 2020-10-08 DIAGNOSIS — F32A Depression, unspecified: Secondary | ICD-10-CM

## 2020-10-08 NOTE — Assessment & Plan Note (Signed)
Discussed trial of low dose mirtazapine to help with sleep, appetite and depression. He would like to consider formal testing and counseling for more evaluation first before considering adding another medication daily. Will set him up with counseling sessions with Marylu Lund. I do wonder a bit about bipolar disorder as a potential diagnosis for him. Will see what Marylu Lund thinks and help with further treatment recommendations.

## 2020-10-08 NOTE — Addendum Note (Signed)
Addended by: Harley Alto on: 10/08/2020 01:52 PM   Modules accepted: Orders

## 2020-10-08 NOTE — Patient Instructions (Addendum)
Please schedule a follow up in 1 month to review your progress and testing results   Stop by the lab on your way out

## 2020-10-08 NOTE — Progress Notes (Signed)
Name: Kenneth Heath  DOB: May 22, 1996 MRN: [id number] PCP: Patient, No Pcp Per (Inactive)     Brief Narrative:  Kenneth Heath is a 25 y.o. male with HIV, Dx 07/2018. CD4 nadir 60.  HIV Risk: MSM.  History of OIs: none Hep B sAg (-), Hep B sAb (-) / cAb (-), Hep A (+), Hep C (-), HLA B*5701 (-), Quantiferon (Indeterminant)   Previous Regimens:  Biktarvy   Genotypes:  07/2018: no PI/RTI mutations    Subjective:   Chief Complaint  Patient presents with  . Follow-up    B20- feels he has some ADHD or depression going on.      HPI: Doing well with Biktarvy once daily - sometimes he changes frequency of taking it 18 - 36 hours depending on the time frame but never misses a calendar day. No side effects that he reports any concerns over. Has been pretty healthy overall regarding physical health and no illnesses or hospitalizations/ER visits since the interval of our last visit.   Feels like he is "all over the place" which as been worsening over the last 3 months. He says that depression has always taken a role to some degree and lately this has been up and down. Sleep is interrupted - sometimes he cannot fall asleep until 2-3 am and wakes at 7; sometimes he sleeps too much but wakes frequently. Feels that he is unfocused and inattentive at work and at home. His stress at work has been mounding over the last 6 months and feels this is contributing to a degree. No other significant life events noted. He feels unproductive, "stuck swirling in place" and racing thoughts he cannot make sense of. He cannot focus enough to plan out a schedule for the day, he has tried. He has taken a leave starting 09/23/2020 to discuss further until he could get in for an appointment; over this time he has noticed some slight improvement. He does have a family history of bipolar disorder and depression.   Forgets to eat frequently. He has lost weight intentionally since our last visit - incorporated  exercise, modified diet significantly. Worried about that he "cannot stop" losing now and wonders if it is due to anything in particular `    Review of Systems  Constitutional: Positive for unexpected weight change. Negative for appetite change, chills, fatigue and fever.  Eyes: Negative for visual disturbance.  Respiratory: Negative for cough and shortness of breath.   Cardiovascular: Negative for chest pain and leg swelling.  Gastrointestinal: Negative for abdominal pain, diarrhea and nausea.  Genitourinary: Negative for dysuria, genital sores and penile discharge.  Musculoskeletal: Negative for joint swelling.  Skin: Negative for color change and rash.  Neurological: Negative for dizziness and headaches.  Hematological: Negative for adenopathy.  Psychiatric/Behavioral: Negative for sleep disturbance. The patient is not nervous/anxious.      Past Medical History:  Diagnosis Date  . Asthma   . Pneumonia     Outpatient Medications Prior to Visit  Medication Sig Dispense Refill  . albuterol (PROVENTIL) (2.5 MG/3ML) 0.083% nebulizer solution Take 3 mLs (2.5 mg total) by nebulization every 6 (six) hours as needed for wheezing or shortness of breath. 75 mL 12  . albuterol (VENTOLIN HFA) 108 (90 Base) MCG/ACT inhaler Inhale 2 puffs into the lungs every 4 (four) hours as needed for wheezing or shortness of breath (cough, shortness of breath or wheezing.). 18 g 3  . bictegravir-emtricitabine-tenofovir AF (BIKTARVY) 50-200-25 MG TABS tablet Take 1 tablet by  mouth daily. 30 tablet 0  . Cetirizine HCl (ZYRTEC PO) Take by mouth.    . fluticasone (FLONASE) 50 MCG/ACT nasal spray Place 1 spray into both nostrils daily. (Patient not taking: Reported on 10/12/2019) 15.8 g 2   No facility-administered medications prior to visit.     No Known Allergies  Social History   Tobacco Use  . Smoking status: Never Smoker  . Smokeless tobacco: Never Used  Substance Use Topics  . Alcohol use: No     Alcohol/week: 0.0 standard drinks  . Drug use: No    Family History  Problem Relation Age of Onset  . Cancer Neg Hx   . Diabetes Neg Hx   . Heart disease Neg Hx     Social History   Substance and Sexual Activity  Sexual Activity Not on file     Objective:   Vitals:   10/08/20 1155  BP: 121/76  Pulse: 83  Resp: 16  SpO2: 99%  Weight: 155 lb 9.6 oz (70.6 kg)  Height: $Remove'5\' 6"'RKtGjxU$  (1.676 m)   Body mass index is 25.11 kg/m.  Physical Exam HENT:     Mouth/Throat:     Mouth: No oral lesions.     Dentition: Normal dentition. No dental caries.  Eyes:     General: No scleral icterus. Cardiovascular:     Rate and Rhythm: Normal rate and regular rhythm.     Heart sounds: Normal heart sounds.  Pulmonary:     Effort: Pulmonary effort is normal.     Breath sounds: Normal breath sounds.  Abdominal:     General: There is no distension.     Palpations: Abdomen is soft.     Tenderness: There is no abdominal tenderness.  Lymphadenopathy:     Cervical: No cervical adenopathy.  Skin:    General: Skin is warm and dry.     Findings: No rash.  Neurological:     Mental Status: He is alert and oriented to person, place, and time.  Psychiatric:        Attention and Perception: He is inattentive.        Mood and Affect: Affect is labile.        Speech: Speech normal.        Behavior: Behavior normal. Behavior is cooperative.        Thought Content: Thought content normal.        Cognition and Memory: Cognition normal.     Lab Results Lab Results  Component Value Date   WBC 5.5 10/12/2019   HGB 15.8 10/12/2019   HCT 45.1 10/12/2019   MCV 92.2 10/12/2019   PLT 202 10/12/2019    Lab Results  Component Value Date   CREATININE 1.29 10/12/2019   BUN 13 10/12/2019   NA 139 10/12/2019   K 4.0 10/12/2019   CL 104 10/12/2019   CO2 27 10/12/2019    Lab Results  Component Value Date   ALT 13 10/12/2019   AST 16 10/12/2019   BILITOT 0.5 10/12/2019    Lab Results  Component  Value Date   CHOL 207 (H) 08/11/2018   HDL 40 08/11/2018   LDLCALC 143 (H) 08/11/2018   TRIG 122 08/11/2018   CHOLHDL 5.2 (H) 08/11/2018   HIV 1 RNA Quant (copies/mL)  Date Value  10/12/2019 <20 DETECTED (A)  10/10/2018 117 (H)  08/11/2018 528,000 (H)   CD4 T Cell Abs (/uL)  Date Value  10/12/2019 317 (L)  10/10/2018 358 (L)  08/11/2018 60 (  L)     Assessment & Plan:   Problem List Items Addressed This Visit      Unprioritized   HIV (human immunodeficiency virus infection) ([place]) (Chronic)    Will need to update pertinent labs today but historically he has done well with his viral suppression and adherence on Biktarvy. Will continue this for him. STI screen per routine today.        Depression    Discussed trial of low dose mirtazapine to help with sleep, appetite and depression. He would like to consider formal testing and counseling for more evaluation first before considering adding another medication daily. Will set him up with counseling sessions with Marcie Bal. I do wonder a bit about bipolar disorder as a potential diagnosis for him. Will see what Marcie Bal thinks and help with further treatment recommendations.       Attention and concentration deficit - Primary    New problem for [place] over the last 3 months with inattention, poor productivity, rapid thoughts, disrupted sleep and poor appetite/weightloss. With features present at work and at home will refer for further ADHD testing with psychology. Alternatively ? If related to bipolar disorder. Will complete FMLA for him to allow 6 weeks off so he can proceed with psychologic testing, counseling and treatment. Forms completed today.       Relevant Orders   Ambulatory referral to Psychology    Other Visit Diagnoses    Screening examination for venereal disease       Encounter for long-term (current) use of medications          Janene Madeira, MSN, NP-C [facility] [place] Pager: [phone] Office: [phone]  10/08/20  12:59 PM

## 2020-10-08 NOTE — Assessment & Plan Note (Signed)
Will need to update pertinent labs today but historically he has done well with his viral suppression and adherence on Biktarvy. Will continue this for him. STI screen per routine today.

## 2020-10-08 NOTE — Assessment & Plan Note (Signed)
New problem for Antron over the last 3 months with inattention, poor productivity, rapid thoughts, disrupted sleep and poor appetite/weightloss. With features present at work and at home will refer for further ADHD testing with psychology. Alternatively ? If related to bipolar disorder. Will complete FMLA for him to allow 6 weeks off so he can proceed with psychologic testing, counseling and treatment. Forms completed today.

## 2020-10-10 LAB — LIPID PANEL
Cholesterol: 161 mg/dL (ref <200)
HDL: 48 mg/dL (ref >=40)
LDL Cholesterol (Calc): 102 mg/dL (calc) — ABNORMAL HIGH
Non-HDL Cholesterol (Calc): 113 mg/dL (calc) (ref <130)
Total CHOL/HDL Ratio: 3.4 (calc) (ref <5.0)
Triglycerides: 39 mg/dL (ref <150)

## 2020-10-10 LAB — RPR: RPR Ser Ql: NONREACTIVE

## 2020-10-10 LAB — CBC WITH DIFFERENTIAL/PLATELET
Absolute Monocytes: 416 cells/uL (ref 200–950)
Basophils Absolute: 29 cells/uL (ref 0–200)
Basophils Relative: 0.9 %
Eosinophils Absolute: 250 cells/uL (ref 15–500)
Eosinophils Relative: 7.8 %
HCT: 49.8 % (ref 38.5–50.0)
Hemoglobin: 17 g/dL (ref 13.2–17.1)
Lymphs Abs: 1213 cells/uL (ref 850–3900)
MCH: 32.3 pg (ref 27.0–33.0)
MCHC: 34.1 g/dL (ref 32.0–36.0)
MCV: 94.5 fL (ref 80.0–100.0)
MPV: 9.2 fL (ref 7.5–12.5)
Monocytes Relative: 13 %
Neutro Abs: 1293 cells/uL — ABNORMAL LOW (ref 1500–7800)
Neutrophils Relative %: 40.4 %
Platelets: 222 10*3/uL (ref 140–400)
RBC: 5.27 10*6/uL (ref 4.20–5.80)
RDW: 13.1 % (ref 11.0–15.0)
Total Lymphocyte: 37.9 %
WBC: 3.2 10*3/uL — ABNORMAL LOW (ref 3.8–10.8)

## 2020-10-10 LAB — COMPLETE METABOLIC PANEL WITH GFR
AG Ratio: 1.5 (calc) (ref 1.0–2.5)
ALT: 10 U/L (ref 9–46)
AST: 13 U/L (ref 10–40)
Albumin: 4.3 g/dL (ref 3.6–5.1)
Alkaline phosphatase (APISO): 66 U/L (ref 36–130)
BUN: 11 mg/dL (ref 7–25)
CO2: 29 mmol/L (ref 20–32)
Calcium: 9.6 mg/dL (ref 8.6–10.3)
Chloride: 105 mmol/L (ref 98–110)
Creat: 1.13 mg/dL (ref 0.60–1.35)
GFR, Est African American: 106 mL/min/{1.73_m2} (ref >=60)
GFR, Est Non African American: 91 mL/min/{1.73_m2} (ref >=60)
Globulin: 2.8 g/dL (calc) (ref 1.9–3.7)
Glucose, Bld: 74 mg/dL (ref 65–99)
Potassium: 4.1 mmol/L (ref 3.5–5.3)
Sodium: 140 mmol/L (ref 135–146)
Total Bilirubin: 0.6 mg/dL (ref 0.2–1.2)
Total Protein: 7.1 g/dL (ref 6.1–8.1)

## 2020-10-10 LAB — T-HELPER CELLS (CD4) COUNT (NOT AT ARMC)
Absolute CD4: 347 cells/uL — ABNORMAL LOW (ref 490–1740)
CD4 T Helper %: 28 % — ABNORMAL LOW (ref 30–61)
Total lymphocyte count: 1258 cells/uL (ref 850–3900)

## 2020-10-10 LAB — HIV-1 RNA QUANT-NO REFLEX-BLD
HIV 1 RNA Quant: 22 Copies/mL — ABNORMAL HIGH
HIV-1 RNA Quant, Log: 1.34 Log cps/mL — ABNORMAL HIGH

## 2020-10-11 ENCOUNTER — Telehealth: Payer: Self-pay

## 2020-10-11 NOTE — Telephone Encounter (Signed)
FMLA paperwork has been filled out by Kenneth Alberts, NP however [facility] needs ROI form filled out to due patient's B20 diagnosis. Patient will be coming for RN visit on Monday to fill out ROI and team will fax forms + chart notes.   Referral was also placed for ambulatory psych. Patient hasn't heard from the team and was following up. Forwarding to referral coordinator to follow up.  Esaiah Wanless Loyola Mast, RN

## 2020-10-11 NOTE — Telephone Encounter (Signed)
Referring office has called him to schedule he has not responded to them

## 2020-10-14 ENCOUNTER — Ambulatory Visit: Payer: BC Managed Care – PPO

## 2020-10-14 ENCOUNTER — Other Ambulatory Visit: Payer: Self-pay

## 2020-10-14 ENCOUNTER — Encounter: Payer: Self-pay | Admitting: Infectious Diseases

## 2020-10-14 DIAGNOSIS — B20 Human immunodeficiency virus [HIV] disease: Secondary | ICD-10-CM

## 2020-10-14 NOTE — Progress Notes (Signed)
Patient here to go over [facility] paperwork.   Sandie Ano, RN

## 2020-10-14 NOTE — Telephone Encounter (Signed)
Patient here for nurse visit to discuss FMLA paperwork. He would prefer that provider first tries to send a letter detailing need for FMLA without discussing his diagnosis.   If this is not adequate, patient has signed an ROI and is okay with office faxing notes that contain his diagnosis. He would ike to be notified if this route needs to be taken before information is faxed. Patient was provided with a copy of his completed forms.   Completed FMLA form and ROI in triage. Staff to fax forms and letter once written by provider.   Sandie Ano, RN

## 2020-10-14 NOTE — Telephone Encounter (Signed)
RN relayed to patient that referring office for psych referral has been trying to reach him. Advised him to follow up with any missed calls he has received.   Sandie Ano, RN

## 2020-10-14 NOTE — Telephone Encounter (Signed)
RN faxed FMLA forms and letter to [facility] with receipt of successful transmission. Forms, letter, and ROI in triage in case additional action is needed.   Sandie Ano, RN

## 2020-10-15 NOTE — Telephone Encounter (Signed)
Received call from Viola at [facility] requesting additional information for FMLA. Requested that she fax over forms for any additional information needed.   Sandie Ano, RN

## 2020-10-15 NOTE — Telephone Encounter (Signed)
Patient returning call, rescheduled counseling for 10/24/20. Notified patient that FMLA forms and letter were faxed and that no office notes have been sent yet.   Sandie Ano, RN

## 2020-10-18 NOTE — Telephone Encounter (Signed)
Additional forms faxed to [facility]. Copy in triage.   Sandie Ano, RN

## 2020-10-24 ENCOUNTER — Ambulatory Visit: Payer: BC Managed Care – PPO

## 2020-10-30 ENCOUNTER — Other Ambulatory Visit (HOSPITAL_COMMUNITY): Payer: Self-pay

## 2020-10-31 ENCOUNTER — Ambulatory Visit: Payer: BC Managed Care – PPO

## 2020-11-07 ENCOUNTER — Ambulatory Visit: Payer: BC Managed Care – PPO

## 2020-11-07 ENCOUNTER — Ambulatory Visit: Payer: BC Managed Care – PPO | Admitting: Infectious Diseases

## 2020-11-08 ENCOUNTER — Ambulatory Visit: Payer: BC Managed Care – PPO | Admitting: Infectious Diseases

## 2020-12-11 ENCOUNTER — Telehealth: Payer: Self-pay

## 2020-12-11 ENCOUNTER — Other Ambulatory Visit (HOSPITAL_COMMUNITY): Payer: Self-pay

## 2020-12-11 NOTE — Telephone Encounter (Signed)
[facility] Patient Advocate Encounter   Was successful in obtaining a [employer] copay card for Biktarvy.  This copay card will make the patients copay $0.00.  I have spoken with the patient.    The billing information is as follows and has been shared with [facility].        [name], CPhT Specialty Pharmacy Patient Advocate [facility] Phone: [phone] Fax:  [phone]  

## 2021-01-02 ENCOUNTER — Other Ambulatory Visit: Payer: Self-pay | Admitting: Infectious Diseases

## 2021-01-02 DIAGNOSIS — J45909 Unspecified asthma, uncomplicated: Secondary | ICD-10-CM

## 2021-01-07 ENCOUNTER — Telehealth: Payer: Self-pay

## 2021-01-07 NOTE — Telephone Encounter (Signed)
PA request for albuterol received from pharmacy; patient has multiple no show's with provider. Will hold off on completing until patient is seen. Will send mychart message to follow up on scheduling office visit.  Brenda Cowher R Gannon Heinzman

## 2021-02-27 ENCOUNTER — Other Ambulatory Visit (HOSPITAL_COMMUNITY): Payer: Self-pay

## 2021-03-03 ENCOUNTER — Telehealth: Payer: Self-pay

## 2021-03-03 NOTE — Telephone Encounter (Signed)
[facility] Patient Advocate Encounter  Completed and sent [employer] Advancing Access application for Biktarvy for this patient who is uninsured.    Patient is approved 02/28/21 through 02/28/22.  BIN      [id number] PCN    [id number] [id number]    [id number] ID        [id number]   Clearance Coots, CPhT Specialty Pharmacy Patient [facility] Phone: [phone] Fax:  [phone]

## 2021-04-30 DIAGNOSIS — J4520 Mild intermittent asthma, uncomplicated: Secondary | ICD-10-CM | POA: Insufficient documentation

## 2021-06-21 ENCOUNTER — Other Ambulatory Visit: Payer: Self-pay | Admitting: Infectious Diseases

## 2021-06-21 DIAGNOSIS — B20 Human immunodeficiency virus [HIV] disease: Secondary | ICD-10-CM

## 2021-06-23 ENCOUNTER — Other Ambulatory Visit: Payer: Self-pay | Admitting: Infectious Diseases

## 2021-06-23 DIAGNOSIS — B20 Human immunodeficiency virus [HIV] disease: Secondary | ICD-10-CM

## 2021-06-23 NOTE — Telephone Encounter (Signed)
Patient scheduled on 04/15/2022

## 2021-06-24 ENCOUNTER — Ambulatory Visit: Payer: Self-pay | Admitting: Infectious Diseases

## 2021-06-24 ENCOUNTER — Telehealth: Payer: Self-pay

## 2021-06-24 NOTE — Telephone Encounter (Signed)
Called patient to see if he'd be able to make it to today's appointment, no answer. Left HIPAA compliant voicemail requesting callback.   Sandie Ano, RN

## 2021-06-25 ENCOUNTER — Ambulatory Visit: Payer: BC Managed Care – PPO | Admitting: Infectious Diseases

## 2021-09-06 ENCOUNTER — Other Ambulatory Visit: Payer: Self-pay | Admitting: Internal Medicine

## 2021-09-06 DIAGNOSIS — B20 Human immunodeficiency virus [HIV] disease: Secondary | ICD-10-CM

## 2021-09-08 ENCOUNTER — Telehealth: Payer: Self-pay

## 2021-09-08 NOTE — Telephone Encounter (Signed)
Attempted to call patient and patient's mother to schedule overdue appointment. Patient has missed multiple appointments with office. Left voicemail at both numbers requesting call back.  ?Kenneth Heath, RMA ? ?

## 2021-11-06 ENCOUNTER — Telehealth: Payer: Self-pay

## 2021-11-06 ENCOUNTER — Ambulatory Visit: Payer: Self-pay | Admitting: Infectious Diseases

## 2021-11-06 NOTE — Telephone Encounter (Signed)
Called patient to see if he would be able to make it to today's appointment, no answer. Left HIPAA compliant voicemail requesting callback.   Finola Rosal D Mayah Urquidi, RN  

## 2021-11-20 DIAGNOSIS — R6884 Jaw pain: Secondary | ICD-10-CM | POA: Diagnosis not present

## 2021-11-20 DIAGNOSIS — K0889 Other specified disorders of teeth and supporting structures: Secondary | ICD-10-CM | POA: Diagnosis not present

## 2021-11-20 DIAGNOSIS — H9202 Otalgia, left ear: Secondary | ICD-10-CM | POA: Diagnosis not present

## 2021-11-20 DIAGNOSIS — K029 Dental caries, unspecified: Secondary | ICD-10-CM | POA: Diagnosis not present

## 2021-11-27 ENCOUNTER — Ambulatory Visit: Payer: BC Managed Care – PPO | Admitting: Pharmacist

## 2021-11-28 ENCOUNTER — Encounter: Payer: Self-pay | Admitting: Infectious Diseases

## 2021-11-28 ENCOUNTER — Other Ambulatory Visit: Payer: Self-pay

## 2021-11-28 ENCOUNTER — Ambulatory Visit: Payer: Self-pay

## 2021-11-28 ENCOUNTER — Ambulatory Visit (INDEPENDENT_AMBULATORY_CARE_PROVIDER_SITE_OTHER): Payer: BC Managed Care – PPO | Admitting: Pharmacist

## 2021-11-28 DIAGNOSIS — Z23 Encounter for immunization: Secondary | ICD-10-CM | POA: Diagnosis not present

## 2021-11-28 DIAGNOSIS — B20 Human immunodeficiency virus [HIV] disease: Secondary | ICD-10-CM | POA: Diagnosis not present

## 2021-11-28 DIAGNOSIS — Z79899 Other long term (current) drug therapy: Secondary | ICD-10-CM | POA: Diagnosis not present

## 2021-12-03 LAB — COMPREHENSIVE METABOLIC PANEL
AG Ratio: 1.1 (calc) (ref 1.0–2.5)
ALT: 9 U/L (ref 9–46)
AST: 11 U/L (ref 10–40)
Albumin: 3.8 g/dL (ref 3.6–5.1)
Alkaline phosphatase (APISO): 75 U/L (ref 36–130)
BUN: 7 mg/dL (ref 7–25)
CO2: 28 mmol/L (ref 20–32)
Calcium: 9 mg/dL (ref 8.6–10.3)
Chloride: 105 mmol/L (ref 98–110)
Creat: 1.14 mg/dL (ref 0.60–1.24)
Globulin: 3.4 g/dL (calc) (ref 1.9–3.7)
Glucose, Bld: 65 mg/dL (ref 65–99)
Potassium: 3.6 mmol/L (ref 3.5–5.3)
Sodium: 141 mmol/L (ref 135–146)
Total Bilirubin: 0.3 mg/dL (ref 0.2–1.2)
Total Protein: 7.2 g/dL (ref 6.1–8.1)

## 2021-12-03 LAB — CBC WITH DIFFERENTIAL/PLATELET
Absolute Monocytes: 636 cells/uL (ref 200–950)
Basophils Absolute: 32 cells/uL (ref 0–200)
Basophils Relative: 0.6 %
Eosinophils Absolute: 201 cells/uL (ref 15–500)
Eosinophils Relative: 3.8 %
HCT: 46.3 % (ref 38.5–50.0)
Hemoglobin: 16.1 g/dL (ref 13.2–17.1)
Lymphs Abs: 1574 cells/uL (ref 850–3900)
MCH: 32.4 pg (ref 27.0–33.0)
MCHC: 34.8 g/dL (ref 32.0–36.0)
MCV: 93.2 fL (ref 80.0–100.0)
MPV: 9.7 fL (ref 7.5–12.5)
Monocytes Relative: 12 %
Neutro Abs: 2857 cells/uL (ref 1500–7800)
Neutrophils Relative %: 53.9 %
Platelets: 203 10*3/uL (ref 140–400)
RBC: 4.97 10*6/uL (ref 4.20–5.80)
RDW: 13.7 % (ref 11.0–15.0)
Total Lymphocyte: 29.7 %
WBC: 5.3 10*3/uL (ref 3.8–10.8)

## 2021-12-03 LAB — HIV RNA, RTPCR W/R GT (RTI, PI,INT)
HIV 1 RNA Quant: 48 copies/mL — ABNORMAL HIGH
HIV-1 RNA Quant, Log: 1.68 Log copies/mL — ABNORMAL HIGH

## 2021-12-03 LAB — LIPID PANEL
Cholesterol: 137 mg/dL (ref <200)
HDL: 48 mg/dL (ref >=40)
LDL Cholesterol (Calc): 76 mg/dL (calc)
Non-HDL Cholesterol (Calc): 89 mg/dL (calc) (ref <130)
Total CHOL/HDL Ratio: 2.9 (calc) (ref <5.0)
Triglycerides: 57 mg/dL (ref <150)

## 2021-12-03 LAB — T-HELPER CELLS (CD4) COUNT (NOT AT ARMC)
Absolute CD4: 269 cells/uL — ABNORMAL LOW (ref 490–1740)
CD4 T Helper %: 18 % — ABNORMAL LOW (ref 30–61)
Total lymphocyte count: 1481 cells/uL (ref 850–3900)

## 2021-12-03 LAB — RPR: RPR Ser Ql: NONREACTIVE

## 2021-12-03 LAB — HEPATITIS B SURFACE ANTIBODY,QUALITATIVE: Hep B S Ab: REACTIVE — AB

## 2021-12-15 ENCOUNTER — Telehealth: Payer: Self-pay

## 2021-12-15 ENCOUNTER — Other Ambulatory Visit: Payer: Self-pay | Admitting: Pharmacist

## 2021-12-15 DIAGNOSIS — B20 Human immunodeficiency virus [HIV] disease: Secondary | ICD-10-CM

## 2021-12-15 MED ORDER — BIKTARVY 50-200-25 MG PO TABS: 1.0000 | ORAL_TABLET | Freq: Every day | ORAL | 0 refills | Status: AC

## 2021-12-15 NOTE — Telephone Encounter (Signed)
Received call from Carollee Herter, pharmacist with [facility] pharmacy regarding Biktarvy refill for this patient. Per note on 11/28/21 from [facility] clinical pharmacist, told Carollee Herter the patient should have enough medication to get to next scheduled appointment with Rexene Alberts on 12/30/21, and would discuss refills at that time. All questions answered.  Wyvonne Lenz, RN

## 2021-12-15 NOTE — Progress Notes (Signed)
Medication Samples have been provided to the patient.  Drug name: Biktarvy        Strength: 50/200/25 mg       Qty: 14 tablets (2 bottles)   LOT: [id number]   Exp.Date: 02/2024  Dosing instructions: Take one tablet by mouth once daily  The patient has been instructed regarding the correct time, dose, and frequency of taking this medication, including desired effects and most common side effects.   Mae Cianci L. Rilley Poulter, PharmD, BCIDP, AAHIVP, CPP Clinical Pharmacist Practitioner Infectious Diseases Clinical Pharmacist [facility] 05/20/2020, 10:07 AM  

## 2021-12-22 ENCOUNTER — Other Ambulatory Visit (HOSPITAL_COMMUNITY): Payer: Self-pay

## 2021-12-23 ENCOUNTER — Other Ambulatory Visit: Payer: Self-pay

## 2021-12-23 ENCOUNTER — Telehealth: Payer: Self-pay

## 2021-12-23 DIAGNOSIS — B20 Human immunodeficiency virus [HIV] disease: Secondary | ICD-10-CM

## 2021-12-23 MED ORDER — BIKTARVY 50-200-25 MG PO TABS: 1.0000 | ORAL_TABLET | Freq: Every day | ORAL | 0 refills | Status: DC
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
## 2021-12-24 ENCOUNTER — Other Ambulatory Visit: Payer: Self-pay

## 2021-12-24 DIAGNOSIS — B20 Human immunodeficiency virus [HIV] disease: Secondary | ICD-10-CM

## 2021-12-24 MED ORDER — BIKTARVY 50-200-25 MG PO TABS: 1.0000 | ORAL_TABLET | Freq: Every day | ORAL | 0 refills | Status: DC

## 2021-12-30 ENCOUNTER — Ambulatory Visit: Payer: BC Managed Care – PPO

## 2021-12-30 ENCOUNTER — Ambulatory Visit: Payer: BC Managed Care – PPO | Admitting: Infectious Diseases

## 2022-01-20 ENCOUNTER — Ambulatory Visit: Payer: BC Managed Care – PPO

## 2022-01-20 ENCOUNTER — Ambulatory Visit: Payer: BC Managed Care – PPO | Admitting: Infectious Diseases

## 2022-01-20 ENCOUNTER — Telehealth: Payer: Self-pay

## 2022-01-20 NOTE — Telephone Encounter (Signed)
Called patient to see if he would be able to make it to today's appointment, no answer. Left HIPAA compliant voicemail requesting callback.   Telina Kleckley D Matalyn Nawaz, RN  

## 2022-01-23 ENCOUNTER — Ambulatory Visit: Payer: BC Managed Care – PPO | Admitting: Infectious Diseases

## 2022-01-26 ENCOUNTER — Telehealth: Payer: Self-pay

## 2022-01-26 DIAGNOSIS — B20 Human immunodeficiency virus [HIV] disease: Secondary | ICD-10-CM

## 2022-01-26 MED ORDER — BIKTARVY 50-200-25 MG PO TABS: 1.0000 | ORAL_TABLET | Freq: Every day | ORAL | 3 refills | Status: DC

## 2022-01-26 NOTE — Telephone Encounter (Signed)
Patient called, he is currently in [place] but working on moving to [place]. It's very hard for him to get transportation to the clinic. He was last seen by Marchelle Folks in June, will send in a few months of refills and he will renew financial via phone visit tomorrow.   Will send list of clinics in [place] area.   Sandie Ano, RN

## 2022-01-26 NOTE — Telephone Encounter (Signed)
Called patient to reschedule missed appointment with Rexene Alberts on 01/23/22. No answer. Left HIPAA-compliant voicemail requesting call back.   Received faxed refill request from [facility] for patient's Biktarvy. Per note, patient needs to schedule appointment/labs for additional refills.  Wyvonne Lenz, RN

## 2022-01-27 ENCOUNTER — Ambulatory Visit: Payer: BC Managed Care – PPO

## 2022-01-27 ENCOUNTER — Other Ambulatory Visit: Payer: Self-pay

## 2022-02-10 DIAGNOSIS — R0981 Nasal congestion: Secondary | ICD-10-CM | POA: Diagnosis not present

## 2022-02-10 DIAGNOSIS — J45909 Unspecified asthma, uncomplicated: Secondary | ICD-10-CM | POA: Diagnosis not present

## 2022-02-10 DIAGNOSIS — J069 Acute upper respiratory infection, unspecified: Secondary | ICD-10-CM | POA: Diagnosis not present

## 2022-02-10 DIAGNOSIS — Z20822 Contact with and (suspected) exposure to covid-19: Secondary | ICD-10-CM | POA: Diagnosis not present

## 2022-02-10 DIAGNOSIS — R519 Headache, unspecified: Secondary | ICD-10-CM | POA: Diagnosis not present

## 2022-02-10 DIAGNOSIS — J029 Acute pharyngitis, unspecified: Secondary | ICD-10-CM | POA: Diagnosis not present

## 2022-03-04 DIAGNOSIS — U071 COVID-19: Secondary | ICD-10-CM | POA: Diagnosis not present

## 2022-03-04 DIAGNOSIS — R059 Cough, unspecified: Secondary | ICD-10-CM | POA: Diagnosis not present

## 2022-03-04 DIAGNOSIS — R000 Tachycardia, unspecified: Secondary | ICD-10-CM | POA: Diagnosis not present

## 2022-03-04 DIAGNOSIS — R079 Chest pain, unspecified: Secondary | ICD-10-CM | POA: Diagnosis not present

## 2022-03-04 DIAGNOSIS — R0602 Shortness of breath: Secondary | ICD-10-CM | POA: Diagnosis not present

## 2022-03-04 DIAGNOSIS — R509 Fever, unspecified: Secondary | ICD-10-CM | POA: Diagnosis not present

## 2022-04-08 ENCOUNTER — Other Ambulatory Visit: Payer: Self-pay

## 2022-04-08 DIAGNOSIS — B20 Human immunodeficiency virus [HIV] disease: Secondary | ICD-10-CM

## 2022-04-08 MED ORDER — BIKTARVY 50-200-25 MG PO TABS: 1.0000 | ORAL_TABLET | Freq: Every day | ORAL | 0 refills | Status: DC

## 2022-04-22 ENCOUNTER — Ambulatory Visit: Payer: BC Managed Care – PPO | Admitting: Infectious Diseases

## 2022-05-14 ENCOUNTER — Other Ambulatory Visit: Payer: Self-pay | Admitting: Infectious Diseases

## 2022-05-14 DIAGNOSIS — B20 Human immunodeficiency virus [HIV] disease: Secondary | ICD-10-CM

## 2022-05-20 ENCOUNTER — Other Ambulatory Visit (HOSPITAL_COMMUNITY): Payer: Self-pay

## 2022-05-20 ENCOUNTER — Other Ambulatory Visit: Payer: Self-pay

## 2022-05-20 ENCOUNTER — Ambulatory Visit (INDEPENDENT_AMBULATORY_CARE_PROVIDER_SITE_OTHER): Payer: BC Managed Care – PPO | Admitting: Infectious Diseases

## 2022-05-20 ENCOUNTER — Other Ambulatory Visit (HOSPITAL_COMMUNITY)
Admission: RE | Admit: 2022-05-20 | Discharge: 2022-05-20 | Disposition: A | Discharge status: Home / Self Care | Payer: BC Managed Care – PPO | Source: Ambulatory Visit | Attending: Infectious Diseases | Admitting: Infectious Diseases

## 2022-05-20 ENCOUNTER — Encounter: Payer: Self-pay | Admitting: Infectious Diseases

## 2022-05-20 VITALS — BP 128/75 | HR 97 | Temp 98.0°F | Ht 67.0 in | Wt 155.0 lb

## 2022-05-20 DIAGNOSIS — Z113 Encounter for screening for infections with a predominantly sexual mode of transmission: Secondary | ICD-10-CM

## 2022-05-20 DIAGNOSIS — Z21 Asymptomatic human immunodeficiency virus [HIV] infection status: Secondary | ICD-10-CM

## 2022-05-20 DIAGNOSIS — Z23 Encounter for immunization: Secondary | ICD-10-CM | POA: Diagnosis not present

## 2022-05-20 DIAGNOSIS — F32A Depression, unspecified: Secondary | ICD-10-CM | POA: Diagnosis not present

## 2022-05-20 DIAGNOSIS — Z0000 Encounter for general adult medical examination without abnormal findings: Secondary | ICD-10-CM

## 2022-05-20 DIAGNOSIS — B20 Human immunodeficiency virus [HIV] disease: Secondary | ICD-10-CM | POA: Diagnosis not present

## 2022-05-20 MED ORDER — BIKTARVY 50-200-25 MG PO TABS
1.0000 | ORAL_TABLET | Freq: Every day | ORAL | 11 refills | Status: DC
  Filled 2022-05-20 – 2022-05-21 (×2): qty 30, 30d supply, fill #0
  Filled 2022-06-17: qty 30, 30d supply, fill #1

## 2022-05-20 NOTE — Patient Instructions (Addendum)
   Look into associates degree for nursing near you.

## 2022-05-20 NOTE — Progress Notes (Signed)
Name: Kenneth Heath  DOB: [birth year] MRN: [id number] PCP: Patient, No Pcp Per     Brief Narrative:  Kenneth Heath is a 26 y.o. male with HIV, Dx 07/2018. CD4 nadir 60.  HIV Risk: MSM.  History of OIs: none Hep B sAg (-), Hep B sAb (-) / cAb (-), Hep A (+), Hep C (-), HLA B*5701 (-), Quantiferon (Indeterminant)    Previous Regimens: Biktarvy 2020   Genotypes: 07/2018: no PI/RTI mutations    Subjective:   Chief Complaint  Patient presents with   Follow-up     HPI: Kenneth Heath is here for routine follow up care for HIV treatment.  Kenneth Heath has been prescribed once daily Biktarvy and has been on this since 2020 when Kenneth Heath was originally diagnosed.  CD4 nadir at that time was only 60.Kenneth Heath saw our pharmacy team in June of this year. Kenneth Heath was doing well at that visit but worried Kenneth Heath has acquired hepatitis based on his interpretation of blood work collected then.   BPs 120-130.  His weight and appetite have been unchanged.  Kenneth Heath is overall sleeping well.  No changes to his health history.   Wt Readings from Last 3 Encounters:  05/20/22 155 lb (70.3 kg)  10/08/20 155 lb 9.6 oz (70.6 kg)  10/12/19 188 lb (85.3 kg)     Review of Systems  Constitutional:  Negative for appetite change, chills, fatigue, fever and unexpected weight change.  Eyes:  Negative for visual disturbance.  Respiratory:  Negative for cough and shortness of breath.   Cardiovascular:  Negative for chest pain and leg swelling.  Gastrointestinal:  Negative for abdominal pain, diarrhea and nausea.  Genitourinary:  Negative for dysuria, genital sores and penile discharge.  Musculoskeletal:  Negative for joint swelling.  Skin:  Negative for color change and rash.  Neurological:  Negative for dizziness and headaches.  Hematological:  Negative for adenopathy.  Psychiatric/Behavioral:  Negative for sleep disturbance. The patient is not nervous/anxious.      Past Medical History:  Diagnosis Date   Asthma    Pneumonia      Outpatient Medications Prior to Visit  Medication Sig Dispense Refill   albuterol (PROVENTIL) (2.5 MG/3ML) 0.083% nebulizer solution Take 3 mLs (2.5 mg total) by nebulization every 6 (six) hours as needed for wheezing or shortness of breath. 75 mL 12   albuterol (VENTOLIN HFA) 108 (90 Base) MCG/ACT inhaler INHALE 2 PUFFS INTO THE LUNGS EVERY 4 HOURS AS NEEDED FOR WHEEZING OR SHORTNESS OF BREATH OR COUGH 18 g 3   Cetirizine HCl (ZYRTEC PO) Take by mouth.     BIKTARVY 50-200-25 MG TABS tablet TAKE 1 TABLET BY MOUTH DAILY 30 tablet 0   No facility-administered medications prior to visit.     Allergies  Allergen Reactions   Amoxicillin Anaphylaxis    Social History   Tobacco Use   Smoking status: Never   Smokeless tobacco: Never  Substance Use Topics   Alcohol use: No    Alcohol/week: 0.0 standard drinks of alcohol   Drug use: No    Family History  Problem Relation Age of Onset   Cancer Neg Hx    Diabetes Neg Hx    Heart disease Neg Hx     Social History   Substance and Sexual Activity  Sexual Activity Not on file   Comment: given condoms     Objective:   Vitals:   05/20/22 1451  BP: 128/75  Pulse: 97  Temp: 98 F (36.7 C)  TempSrc:  Oral  SpO2: 99%  Weight: 155 lb (70.3 kg)  Height: _0  (1.702 m)   Body mass index is 24.28 kg/m.  Physical Exam Constitutional:      Appearance: Normal appearance. Kenneth Heath is not ill-appearing.  HENT:     Head: Normocephalic.     Mouth/Throat:     Mouth: Mucous membranes are moist.     Pharynx: Oropharynx is clear.  Eyes:     General: No scleral icterus. Cardiovascular:     Rate and Rhythm: Normal rate and regular rhythm.  Pulmonary:     Effort: Pulmonary effort is normal.  Musculoskeletal:        General: Normal range of motion.     Cervical back: Normal range of motion.  Skin:    Coloration: Skin is not jaundiced or pale.  Neurological:     Mental Status: Kenneth Heath is alert and oriented to person, place, and time.   Psychiatric:        Mood and Affect: Mood normal.        Thought Content: Thought content normal.        Judgment: Judgment normal.     Lab Results Lab Results  Component Value Date   WBC 5.3 11/28/2021   HGB 16.1 11/28/2021   HCT 46.3 11/28/2021   MCV 93.2 11/28/2021   PLT 203 11/28/2021    Lab Results  Component Value Date   CREATININE 1.13 05/20/2022   BUN 12 05/20/2022   NA 140 05/20/2022   K 3.9 05/20/2022   CL 103 05/20/2022   CO2 28 05/20/2022    Lab Results  Component Value Date   ALT 9 05/20/2022   AST 14 05/20/2022   BILITOT 0.6 05/20/2022    Lab Results  Component Value Date   CHOL 137 11/28/2021   HDL 48 11/28/2021   LDLCALC 76 11/28/2021   TRIG 57 11/28/2021   CHOLHDL 2.9 11/28/2021   HIV 1 RNA Quant  Date Value  05/20/2022 <20 Copies/mL (H)  11/28/2021 48 copies/mL (H)  10/08/2020 22 Copies/mL (H)   CD4 T Cell Abs (/uL)  Date Value  10/12/2019 317 (L)  10/10/2018 358 (L)  08/11/2018 60 (L)     Assessment & Plan:   Problem List Items Addressed This Visit       Unprioritized   HIV (human immunodeficiency virus infection) ([place]) (Chronic)    Very well controlled on once daily Biktarvy. No concerns with access or adherence to medication. They are tolerating the medication well without side effects. No drug interactions identified. Pertinent lab tests ordered today.  No changes to insurance coverage.  No dental needs today.  No concern over anxious/depressed mood.  Sexual health and family planning discussed -asymptomatic screening today.  Vaccines updated today - see health maintenance section.    Clarified with Shelly today that Kenneth Heath does not actually have any hepatitis.  Kenneth Heath has immunity to hepatitis B following the booster vaccine that we gave him.  Kenneth Heath was quite relieved after we discussed this.  Will have him back in 6 months for routine care.       Relevant Medications   bictegravir-emtricitabine-tenofovir AF (BIKTARVY) 50-200-25  MG TABS tablet   Depression    His mood seems to be doing well today after our last discussion in May 2022.  I think Kenneth Heath is adjusted well to his medication regimen.  Not currently maintained on any antidepressants.      Healthcare maintenance    Hesitant to receive the flu  shot today.  Will provide his third and final vaccination for HPV given Kenneth Heath only received 2 doses after the age of 32.  This will complete the series for him.  Kenneth Heath is otherwise up-to-date on the pneumonia and meningitis vaccine series. Routine asymptomatic sexual health screening collected today.      Other Visit Diagnoses     HIV disease ([place])    -  Primary   Relevant Medications   bictegravir-emtricitabine-tenofovir AF (BIKTARVY) 50-200-25 MG TABS tablet   Other Relevant Orders   COMPLETE METABOLIC PANEL WITH GFR (Completed)   HIV 1 RNA quant-no reflex-bld (Completed)   HPV 9-valent vaccine,Recombinat (Completed)   Screening examination for venereal disease       Relevant Orders   RPR (Completed)   Urine cytology ancillary only (Completed)   Cytology (oral, anal, urethral) ancillary only (Completed)   Cytology (oral, anal, urethral) ancillary only (Completed)        Janene Madeira, MSN, NP-C [facility] [place] Pager: [phone] Office: [phone]  05/29/22  1:38 PM

## 2022-05-21 ENCOUNTER — Other Ambulatory Visit (HOSPITAL_COMMUNITY): Payer: Self-pay

## 2022-05-21 ENCOUNTER — Other Ambulatory Visit: Payer: Self-pay | Admitting: Pharmacist

## 2022-05-21 DIAGNOSIS — B20 Human immunodeficiency virus [HIV] disease: Secondary | ICD-10-CM

## 2022-05-21 LAB — CYTOLOGY, (ORAL, ANAL, URETHRAL) ANCILLARY ONLY
Chlamydia: NEGATIVE
Chlamydia: NEGATIVE
Comment: NEGATIVE
Comment: NEGATIVE
Comment: NORMAL
Comment: NORMAL
Neisseria Gonorrhea: NEGATIVE
Neisseria Gonorrhea: NEGATIVE

## 2022-05-21 LAB — URINE CYTOLOGY ANCILLARY ONLY
Chlamydia: NEGATIVE
Comment: NEGATIVE
Comment: NORMAL
Neisseria Gonorrhea: NEGATIVE

## 2022-05-21 MED ORDER — BICTEGRAVIR-EMTRICITAB-TENOFOV 50-200-25 MG PO TABS: 1.0000 | ORAL_TABLET | Freq: Every day | ORAL | 0 refills | Status: AC

## 2022-05-21 NOTE — Progress Notes (Signed)
Medication Samples have been provided to the patient.  Drug name: Biktarvy        Strength: 50/200/25 mg       Qty: 7 tablets (1 bottles) LOT: CPBDLA   Exp.Date: 3/26  Dosing instructions: Take one tablet by mouth once daily  The patient has been instructed regarding the correct time, dose, and frequency of taking this medication, including desired effects and most common side effects.   Lonzo Saulter, PharmD, CPP, BCIDP, AAHIVP Clinical Pharmacist Practitioner Infectious Diseases Clinical Pharmacist [facility]  

## 2022-05-22 ENCOUNTER — Other Ambulatory Visit (HOSPITAL_COMMUNITY): Payer: Self-pay

## 2022-05-22 ENCOUNTER — Other Ambulatory Visit: Payer: Self-pay

## 2022-05-23 LAB — COMPLETE METABOLIC PANEL WITH GFR
AG Ratio: 1.4 (calc) (ref 1.0–2.5)
ALT: 9 U/L (ref 9–46)
AST: 14 U/L (ref 10–40)
Albumin: 4.5 g/dL (ref 3.6–5.1)
Alkaline phosphatase (APISO): 70 U/L (ref 36–130)
BUN: 12 mg/dL (ref 7–25)
CO2: 28 mmol/L (ref 20–32)
Calcium: 9.5 mg/dL (ref 8.6–10.3)
Chloride: 103 mmol/L (ref 98–110)
Creat: 1.13 mg/dL (ref 0.60–1.24)
Globulin: 3.3 g/dL (calc) (ref 1.9–3.7)
Glucose, Bld: 73 mg/dL (ref 65–99)
Potassium: 3.9 mmol/L (ref 3.5–5.3)
Sodium: 140 mmol/L (ref 135–146)
Total Bilirubin: 0.6 mg/dL (ref 0.2–1.2)
Total Protein: 7.8 g/dL (ref 6.1–8.1)
eGFR: 93 mL/min/{1.73_m2} (ref >=60)

## 2022-05-23 LAB — HIV-1 RNA QUANT-NO REFLEX-BLD
HIV 1 RNA Quant: <20 Copies/mL — ABNORMAL HIGH
HIV-1 RNA Quant, Log: <1.3 Log cps/mL — ABNORMAL HIGH

## 2022-05-23 LAB — RPR: RPR Ser Ql: NONREACTIVE

## 2022-05-29 NOTE — Assessment & Plan Note (Signed)
Hesitant to receive the flu shot today.  Will provide his third and final vaccination for HPV given he only received 2 doses after the age of 34.  This will complete the series for him.  He is otherwise up-to-date on the pneumonia and meningitis vaccine series. Routine asymptomatic sexual health screening collected today.

## 2022-05-29 NOTE — Assessment & Plan Note (Addendum)
Very well controlled on once daily Biktarvy. No concerns with access or adherence to medication. They are tolerating the medication well without side effects. No drug interactions identified. Pertinent lab tests ordered today.  No changes to insurance coverage.  No dental needs today.  No concern over anxious/depressed mood.  Sexual health and family planning discussed -asymptomatic screening today.  Vaccines updated today - see health maintenance section.    Clarified with Kenneth Heath today that he does not actually have any hepatitis.  He has immunity to hepatitis B following the booster vaccine that we gave him.  He was quite relieved after we discussed this.  Will have him back in 6 months for routine care.

## 2022-05-29 NOTE — Assessment & Plan Note (Signed)
His mood seems to be doing well today after our last discussion in May 2022.  I think he is adjusted well to his medication regimen.  Not currently maintained on any antidepressants.

## 2022-06-16 ENCOUNTER — Other Ambulatory Visit (HOSPITAL_COMMUNITY): Payer: Self-pay

## 2022-06-17 ENCOUNTER — Other Ambulatory Visit (HOSPITAL_COMMUNITY): Payer: Self-pay

## 2022-06-20 ENCOUNTER — Other Ambulatory Visit: Payer: Self-pay

## 2022-06-22 ENCOUNTER — Other Ambulatory Visit (HOSPITAL_COMMUNITY): Payer: Self-pay

## 2022-07-10 ENCOUNTER — Other Ambulatory Visit: Payer: Self-pay

## 2022-07-10 DIAGNOSIS — B20 Human immunodeficiency virus [HIV] disease: Secondary | ICD-10-CM

## 2022-07-10 MED ORDER — BIKTARVY 50-200-25 MG PO TABS: 1.0000 | ORAL_TABLET | Freq: Every day | ORAL | 0 refills | Status: DC

## 2022-07-15 ENCOUNTER — Other Ambulatory Visit (HOSPITAL_COMMUNITY): Payer: Self-pay

## 2022-09-17 ENCOUNTER — Other Ambulatory Visit: Payer: Self-pay

## 2022-09-17 DIAGNOSIS — B20 Human immunodeficiency virus [HIV] disease: Secondary | ICD-10-CM

## 2022-09-17 MED ORDER — BIKTARVY 50-200-25 MG PO TABS: 1.0000 | ORAL_TABLET | Freq: Every day | ORAL | 3 refills | Status: DC

## 2022-09-18 ENCOUNTER — Other Ambulatory Visit: Payer: Self-pay | Admitting: Infectious Diseases

## 2022-09-18 DIAGNOSIS — B20 Human immunodeficiency virus [HIV] disease: Secondary | ICD-10-CM

## 2022-10-07 ENCOUNTER — Ambulatory Visit: Payer: Medicaid Other | Admitting: Infectious Diseases

## 2022-11-30 ENCOUNTER — Ambulatory Visit: Payer: BC Managed Care – PPO | Admitting: Infectious Diseases

## 2022-11-30 ENCOUNTER — Ambulatory Visit: Payer: Medicaid Other | Admitting: Infectious Diseases

## 2022-12-04 ENCOUNTER — Ambulatory Visit: Payer: Medicaid Other | Admitting: Infectious Diseases

## 2022-12-08 ENCOUNTER — Other Ambulatory Visit (HOSPITAL_COMMUNITY)
Admission: RE | Admit: 2022-12-08 | Discharge: 2022-12-08 | Disposition: A | Discharge status: Home / Self Care | Payer: Medicaid Other | Source: Ambulatory Visit | Attending: Infectious Diseases | Admitting: Infectious Diseases

## 2022-12-08 ENCOUNTER — Other Ambulatory Visit: Payer: Self-pay

## 2022-12-08 ENCOUNTER — Encounter: Payer: Self-pay | Admitting: Infectious Diseases

## 2022-12-08 ENCOUNTER — Ambulatory Visit (INDEPENDENT_AMBULATORY_CARE_PROVIDER_SITE_OTHER): Payer: Medicaid Other | Admitting: Infectious Diseases

## 2022-12-08 VITALS — BP 132/79 | HR 100 | Temp 98.4°F | Wt 158.0 lb

## 2022-12-08 DIAGNOSIS — Z0000 Encounter for general adult medical examination without abnormal findings: Secondary | ICD-10-CM

## 2022-12-08 DIAGNOSIS — B20 Human immunodeficiency virus [HIV] disease: Secondary | ICD-10-CM | POA: Diagnosis not present

## 2022-12-08 DIAGNOSIS — Z113 Encounter for screening for infections with a predominantly sexual mode of transmission: Secondary | ICD-10-CM | POA: Diagnosis present

## 2022-12-08 DIAGNOSIS — Z1212 Encounter for screening for malignant neoplasm of rectum: Secondary | ICD-10-CM

## 2022-12-08 DIAGNOSIS — Z21 Asymptomatic human immunodeficiency virus [HIV] infection status: Secondary | ICD-10-CM

## 2022-12-08 NOTE — Patient Instructions (Addendum)
Refills sent in for biktarvy,   See you in 6 months!

## 2022-12-08 NOTE — Progress Notes (Signed)
Name: Kenneth Heath  DOB: January 14, 1996 MRN: [id number] PCP: Patient, No Pcp Per     Brief Narrative:  Kenneth Heath is a 27 y.o. male with HIV, Dx 07/2018. CD4 nadir 60.  HIV Risk: MSM.  History of OIs: none Hep B sAg (-), Hep B sAb (-) / cAb (-), Hep A (+), Hep C (-), HLA B*5701 (-), Quantiferon (Indeterminant)    Previous Regimens: Biktarvy 2020   Genotypes: 07/2018: no PI/RTI mutations    Subjective:   CC:  HIV follow up care.     HPI: Kenneth Heath continues to do well and takes his Biktarvy everyday without periods of significant lapses in treatment.  He would like sexual health screenings today - asymptomatic and without any known exposures - including anorectal cancer screening for cytology/HPV. He has questions about HPV and role with anal cancer.    Wt Readings from Last 3 Encounters:  12/08/22 158 lb (71.7 kg)  05/20/22 155 lb (70.3 kg)  10/08/20 155 lb 9.6 oz (70.6 kg)     Review of Systems  Constitutional:  Negative for appetite change, chills, fatigue, fever and unexpected weight change.  Eyes:  Negative for visual disturbance.  Respiratory:  Negative for cough and shortness of breath.   Cardiovascular:  Negative for chest pain and leg swelling.  Gastrointestinal:  Negative for abdominal pain, diarrhea and nausea.  Genitourinary:  Negative for dysuria, genital sores and penile discharge.  Musculoskeletal:  Negative for joint swelling.  Skin:  Negative for color change and rash.  Neurological:  Negative for dizziness and headaches.  Hematological:  Negative for adenopathy.  Psychiatric/Behavioral:  Negative for sleep disturbance. The patient is not nervous/anxious.      Past Medical History:  Diagnosis Date   Asthma    Pneumonia     Outpatient Medications Prior to Visit  Medication Sig Dispense Refill   albuterol (VENTOLIN HFA) 108 (90 Base) MCG/ACT inhaler INHALE 2 PUFFS INTO THE LUNGS EVERY 4 HOURS AS NEEDED FOR WHEEZING OR SHORTNESS OF  BREATH OR COUGH 18 g 3   BIKTARVY 50-200-25 MG TABS tablet TAKE 1 TABLET BY MOUTH DAILY 30 tablet 3   albuterol (PROVENTIL) (2.5 MG/3ML) 0.083% nebulizer solution Take 3 mLs (2.5 mg total) by nebulization every 6 (six) hours as needed for wheezing or shortness of breath. (Patient not taking: Reported on 12/08/2022) 75 mL 12   Cetirizine HCl (ZYRTEC PO) Take by mouth. (Patient not taking: Reported on 12/08/2022)     No facility-administered medications prior to visit.     Allergies  Allergen Reactions   Amoxicillin Anaphylaxis    Social History   Tobacco Use   Smoking status: Never   Smokeless tobacco: Never  Substance Use Topics   Alcohol use: No    Alcohol/week: 0.0 standard drinks of alcohol   Drug use: No    Family History  Problem Relation Age of Onset   Cancer Neg Hx    Diabetes Neg Hx    Heart disease Neg Hx     Social History   Substance and Sexual Activity  Sexual Activity Not on file   Comment: given condoms     Objective:   Vitals:   12/08/22 1548  BP: 132/79  Pulse: 100  Temp: 98.4 F (36.9 C)  TempSrc: Oral  SpO2: 95%  Weight: 158 lb (71.7 kg)   Body mass index is 24.75 kg/m.  Physical Exam Constitutional:      Appearance: Normal appearance. He is not ill-appearing.  HENT:  Head: Normocephalic.     Mouth/Throat:     Mouth: Mucous membranes are moist.     Pharynx: Oropharynx is clear.  Eyes:     General: No scleral icterus. Cardiovascular:     Rate and Rhythm: Normal rate and regular rhythm.  Pulmonary:     Effort: Pulmonary effort is normal.  Musculoskeletal:        General: Normal range of motion.     Cervical back: Normal range of motion.  Skin:    Coloration: Skin is not jaundiced or pale.  Neurological:     Mental Status: He is alert and oriented to person, place, and time.  Psychiatric:        Mood and Affect: Mood normal.        Thought Content: Thought content normal.        Judgment: Judgment normal.     Lab  Results Lab Results  Component Value Date   WBC 3.2 (L) 12/08/2022   HGB 15.7 12/08/2022   HCT 45.4 12/08/2022   MCV 92.3 12/08/2022   PLT 198 12/08/2022    Lab Results  Component Value Date   CREATININE 1.13 05/20/2022   BUN 12 05/20/2022   NA 140 05/20/2022   K 3.9 05/20/2022   CL 103 05/20/2022   CO2 28 05/20/2022    Lab Results  Component Value Date   ALT 9 05/20/2022   AST 14 05/20/2022   BILITOT 0.6 05/20/2022    Lab Results  Component Value Date   CHOL 137 11/28/2021   HDL 48 11/28/2021   LDLCALC 76 11/28/2021   TRIG 57 11/28/2021   CHOLHDL 2.9 11/28/2021   HIV 1 RNA Quant  Date Value  12/08/2022 22 Copies/mL (H)  05/20/2022 <20 Copies/mL (H)  11/28/2021 48 copies/mL (H)   CD4 T Cell Abs (/uL)  Date Value  12/08/2022 375 (L)  10/12/2019 317 (L)  10/10/2018 358 (L)     Assessment & Plan:   Problem List Items Addressed This Visit       Unprioritized   Healthcare maintenance    Anorectal cancer screening discussed as well as role for HPV prevention through vaccination.  He completed HPV vaccines in 2023. Will check one time anal cytology (self collected today) and if normal will resume annual screenings at 27 yo.  Flu / covid vaccine recommended in the fall locally.  Asymptomatic sexual health screenings today.       HIV (human immunodeficiency virus infection) (HCC) (Chronic)    Excellent virologic control on once daily Biktarvy. No concerns with access or adherence to medication. They are tolerating the medication well without side effects. No drug interactions identified. Pertinent lab tests ordered today. Refills provided.  No changes to insurance coverage.  No dental needs today.  No concern over anxious/depressed mood.  Sexual health and family planning discussed - asymptomatic screening today.  Anal pap smear today   FU in 58m.        Relevant Medications   bictegravir-emtricitabine-tenofovir AF (BIKTARVY) 50-200-25 MG TABS tablet    Other Visit Diagnoses     Screening examination for venereal disease    -  Primary   Relevant Orders   Cytology (oral, anal, urethral) ancillary only (Completed)   Cytology (oral, anal, urethral) ancillary only (Completed)   Urine cytology ancillary only (Completed)   HIV disease (HCC)       Relevant Medications   bictegravir-emtricitabine-tenofovir AF (BIKTARVY) 50-200-25 MG TABS tablet   Other Relevant Orders  RPR (Completed)   CBC (Completed)   T-helper cells (CD4) count (Completed)   HIV 1 RNA quant-no reflex-bld (Completed)   Screening for rectal cancer       Relevant Orders   Cytology - PAP (Completed)       Rexene Alberts, MSN, NP-C [facility] Pager: [phone] Office: [phone]  12/23/22  2:04 PM

## 2022-12-09 LAB — T-HELPER CELLS (CD4) COUNT (NOT AT ARMC)
CD4 % Helper T Cell: 23 % — ABNORMAL LOW (ref 33–65)
CD4 T Cell Abs: 375 /uL — ABNORMAL LOW (ref 400–1790)

## 2022-12-10 LAB — HIV-1 RNA QUANT-NO REFLEX-BLD
HIV 1 RNA Quant: 22 Copies/mL — ABNORMAL HIGH
HIV-1 RNA Quant, Log: 1.33 Log cps/mL — ABNORMAL HIGH

## 2022-12-10 LAB — RPR: RPR Ser Ql: NONREACTIVE

## 2022-12-10 LAB — CBC
HCT: 45.4 % (ref 38.5–50.0)
Hemoglobin: 15.7 g/dL (ref 13.2–17.1)
MCH: 31.9 pg (ref 27.0–33.0)
MCHC: 34.6 g/dL (ref 32.0–36.0)
MCV: 92.3 fL (ref 80.0–100.0)
MPV: 9.8 fL (ref 7.5–12.5)
Platelets: 198 10*3/uL (ref 140–400)
RBC: 4.92 10*6/uL (ref 4.20–5.80)
RDW: 12.8 % (ref 11.0–15.0)
WBC: 3.2 10*3/uL — ABNORMAL LOW (ref 3.8–10.8)

## 2022-12-11 LAB — CYTOLOGY, (ORAL, ANAL, URETHRAL) ANCILLARY ONLY
Chlamydia: NEGATIVE
Chlamydia: NEGATIVE
Comment: NEGATIVE
Comment: NEGATIVE
Comment: NORMAL
Comment: NORMAL
Neisseria Gonorrhea: NEGATIVE
Neisseria Gonorrhea: NEGATIVE

## 2022-12-11 LAB — URINE CYTOLOGY ANCILLARY ONLY
Chlamydia: NEGATIVE
Comment: NEGATIVE
Comment: NORMAL
Neisseria Gonorrhea: NEGATIVE

## 2022-12-14 LAB — CYTOLOGY - PAP
Comment: NEGATIVE
Diagnosis: NEGATIVE
High risk HPV: NEGATIVE

## 2022-12-23 MED ORDER — BIKTARVY 50-200-25 MG PO TABS: 1.0000 | ORAL_TABLET | Freq: Every day | ORAL | 11 refills | Status: DC

## 2022-12-23 NOTE — Assessment & Plan Note (Signed)
Anorectal cancer screening discussed as well as role for HPV prevention through vaccination.  He completed HPV vaccines in 2023. Will check one time anal cytology (self collected today) and if normal will resume annual screenings at 27 yo.  Flu / covid vaccine recommended in the fall locally.  Asymptomatic sexual health screenings today.

## 2022-12-23 NOTE — Assessment & Plan Note (Signed)
Excellent virologic control on once daily Biktarvy. No concerns with access or adherence to medication. They are tolerating the medication well without side effects. No drug interactions identified. Pertinent lab tests ordered today. Refills provided.  No changes to insurance coverage.  No dental needs today.  No concern over anxious/depressed mood.  Sexual health and family planning discussed - asymptomatic screening today.  Anal pap smear today   FU in 31m.

## 2022-12-29 ENCOUNTER — Ambulatory Visit: Payer: Medicaid Other | Admitting: Infectious Diseases

## 2023-06-14 ENCOUNTER — Ambulatory Visit: Payer: Medicaid Other | Admitting: Infectious Diseases

## 2024-02-08 NOTE — Progress Notes (Signed)
 Kenneth Heath is a 28 y.o. (DOB July 29, 1995) male to discuss the following:  Assessment and Plan  1. HIV infection, unspecified symptom status (*) (Primary) Patient has a history of HIV in which he takes Biktarvy  and states that today is his last dose.  He has not had any missed doses.  He denies urinary issues, abdominal issues.  We will check kidney function, liver function, STI testing.  Encourage patient to follow-up every 3 months for surveillance labs for HIV in order to be prescribed Biktarvy  long-term.  Encourage patient to follow-up with infectious disease in which the number was provided. -     BIKTARVY  50-200-25 MG per tablet; Take one tablet by mouth daily for 90 doses., Starting Tue 02/08/2024, Until Mon 05/08/2024, Normal -     HIV RNA, Quantitative, PCR; Future -     Comprehensive Metabolic Panel; Future -     T-Helper Cells (CD4) Count; Future -     POCT CBC w/Diff -     POCT ACR URINE; Future; Expected date: 02/08/2024 -     CT, GC & TV, NAA vag swab Urine; Future -     Syphilis: RPR With Reflex to RPR Titer and Treponemal Ab; Future -     Acute Viral Hepatitis (HAV, HBV, HCV); Future 2. High risk medication use Elevated liver enzymes are likely a side effect of Biktarvy . Limiting intake of fatty foods and alcohol was advised. Liver enzymes will be rechecked with today's labs. -     BIKTARVY  50-200-25 MG per tablet; Take one tablet by mouth daily for 90 doses., Starting Tue 02/08/2024, Until Mon 05/08/2024, Normal -     HIV RNA, Quantitative, PCR; Future -     Comprehensive Metabolic Panel; Future -     T-Helper Cells (CD4) Count; Future -     POCT CBC w/Diff -     POCT ACR URINE; Future; Expected date: 02/08/2024 -     CT, GC & TV, NAA vag swab Urine; Future -     Syphilis: RPR With Reflex to RPR Titer and Treponemal Ab; Future -     Acute Viral Hepatitis (HAV, HBV, HCV); Future 3. High risk bisexual behavior Instructions provided to take two doxycycline  200 mg tablets  within 72 hours post any oral, anal, or vaginal intercourse. Referral to an infectious disease specialist recommended for at least annual visits. Doxycycline  prescription sent to pharmacy. -     doxycycline  monohydrate (MONODOX ) 100 mg capsule; Take two capsules (200 mg dose) by mouth see administration instructions for 14 days. 200 mg dose of doxycycline  as soon as possible, but no later than 72 hours, after having condomless oral, anal, or vaginal sex., Starting Tue 02/08/2024, Until Tue 02/22/2024 at 2359, Normal   Risks, benefits, and alternatives of the medications and treatment plan prescribed today were discussed, and patient expressed understanding. Plan follow-up as discussed or as needed if any worsening symptoms or change in condition.   Parts of this note were transcribed using transcription software and is prone to transcriptional errors.  If there are questions surrounding the dictation please contact my office at [phone] for clarification. Patient's Medications  New Prescriptions   DOXYCYCLINE  MONOHYDRATE (MONODOX ) 100 MG CAPSULE    Take two capsules (200 mg dose) by mouth see administration instructions for 14 days. 200 mg dose of doxycycline  as soon as possible, but no later than 72 hours, after having condomless oral, anal, or vaginal sex.  Previous Medications   ALBUTEROL  SULFATE HFA (PROVENTIL ,VENTOLIN ,PROAIR )  108 (90 BASE) MCG/ACT INHALER    Inhale two puffs into the lungs every 4 (four) hours as needed.   CETIRIZINE  (ZYRTEC  ALLERGY) 10 MG TABLET    Take one tablet (10 mg dose) by mouth daily.   CETIRIZINE  (ZYRTEC ) 10 MG TABLET    Take by mouth.   FLUTICASONE  PROPIONATE (FLONASE ) 50 MCG/ACTUATION NASAL SPRAY    one spray by Both Nostrils route daily.   OLOPATADINE HCL (PATANOL) 0.1% OPHTHALMIC SOLUTION    Place one drop into both eyes 2 (two) times daily.  Modified Medications   Modified Medication Previous Medication   BIKTARVY  50-200-25 MG PER TABLET BIKTARVY  50-200-25 MG per  tablet      Take one tablet by mouth daily for 90 doses.    Take one tablet by mouth daily for 14 days.  Discontinued Medications   No medications on file       Subjective      Patient presents with  . Follow-up    Patient is requesting medication refill, blood work    History of Present Illness The patient presents for a follow-up visit.  Transportation Issues They have been unable to attend their scheduled appointments due to transportation issues, as they currently reside in [facility], approximately 3 hours away. They have not yet consulted with the infectious disease specialist to whom they were referred in 09/2023. - Onset: Since moving to [facility]. - Location: Resides in [place], approximately 3 hours away. - Duration: Ongoing. - Character: Unable to attend scheduled appointments.  Medication Refill Request They are seeking a refill of their Biktarvy  prescription, which they have been taking consistently without any missed doses over the past few months. They took their last dose last night. - Onset: Past few months. - Duration: Consistently without any missed doses. - Timing: Took their last dose last night.  Increased Appetite and Frequent Hunger They report no urinary issues, nausea, or vomiting. However, they mention an increased appetite and frequent feelings of hunger, which they attribute to their high level of physical activity. - Character: Increased appetite and frequent feelings of hunger. - Alleviating/Aggravating Factors: High level of physical activity.  Preventive Doxycycline  Use They have been using doxycycline  as a preventive measure, although not on a daily basis. Their sexual activity is currently low, with no partners in the past 6 months. They identify as bisexual. - Onset: Not specified. - Duration: Not on a daily basis. - Character: Preventive measure. - Timing: Sexual activity currently low, no partners in the past 6  months.  Occupation: Financial planner Diet: High in fatty foods Alcohol: Does not consume alcohol Sexual Practices: Bisexual, no partners in the past 6 months Living Condition: Resides in [place]   Reviewed and updated this visit by provider: Tobacco  Allergies  Meds  Problems  Med Hx  Surg Hx  Fam Hx       Review of Systems: Pertinent items are noted in HPI - all other systems reviewed and negative.      Objective   Vitals:   02/08/24 0849  BP: 112/68  Patient Position: Sitting  Pulse: 72  Height: 5' 6.5 (1.689 m)  Weight: 154 lb 6 oz (70 kg)  SpO2: 99%  BMI (Calculated): 24.5    Physical Exam Constitutional:      Appearance: Normal appearance.  HENT:     Right Ear: External ear normal.     Left Ear: External ear normal.     Nose: Nose normal.  Mouth/Throat:     Pharynx: Oropharynx is clear.  Eyes:     Extraocular Movements: Extraocular movements intact.     Conjunctiva/sclera: Conjunctivae normal.  Cardiovascular:     Rate and Rhythm: Normal rate and regular rhythm.     Heart sounds: Normal heart sounds. No murmur heard. Pulmonary:     Effort: Pulmonary effort is normal. No respiratory distress.     Breath sounds: Normal breath sounds. No wheezing.  Skin:    General: Skin is warm.  Neurological:     General: No focal deficit present.     Mental Status: He is alert and oriented to person, place, and time.  Psychiatric:        Mood and Affect: Mood normal.     Results

## 2024-04-25 ENCOUNTER — Telehealth: Payer: Self-pay

## 2024-04-25 NOTE — Telephone Encounter (Signed)
 Attempt to Re-Engage in Care  No office visit or HIV labs completed at [facility] within the last 12 months. Patient is considered out of care.   Last [facility] Visit: 12/08/22  Last HIV Viral Load:  HIV 1 RNA Quant  Date Value Ref Range Status  12/08/2022 22 (H) Copies/mL Final    Last CD4 Count:  CD4 T Cell Abs  Date Value Ref Range Status  12/08/2022 375 (L) 400 - 1,790 /uL Final    Medication Dispense History:   Dispensed Days Supply Quantity Provider Pharmacy  BIKTARVY  50/200/25MG  TABLETS 03/21/2024 60 60 each Joshua Viktoria Beat, MD [facility]...  BIKTARVY  50/200/25MG  TABLETS 02/08/2024 30 30 each Joshua Viktoria Beat, MD [facility] #...  BIKTARVY  50/200/25MG  TABLETS 12/23/2023 30 30 each Joshua Viktoria Beat, MD [facility]ORE #...    Interventions: Called [facility] to schedule appointment, no answer. Left HIPAA compliant voicemail requesting callback.   He has been getting Biktarvy  refills from his PCP with [facility] (Dr. Joshua). He saw his PCP on 9/2 and had labs done (VL 2,390 CD4 384 per [facility]). His PCP encouraged him to follow up with ID and provided him with clinic number.   Duration of Services: 10 minutes  Capricia Serda, BSN, [facility]

## 2024-05-23 ENCOUNTER — Telehealth: Payer: Self-pay

## 2024-05-23 NOTE — Telephone Encounter (Signed)
 Left patient a voice mail to call back to schedule an appointment with Corean Fireman.

## 2024-06-19 ENCOUNTER — Ambulatory Visit: Admitting: Infectious Diseases

## 2024-06-19 ENCOUNTER — Other Ambulatory Visit: Payer: Self-pay

## 2024-06-19 DIAGNOSIS — B20 Human immunodeficiency virus [HIV] disease: Secondary | ICD-10-CM

## 2024-06-19 MED ORDER — BIKTARVY 50-200-25 MG PO TABS: 1.0000 | ORAL_TABLET | Freq: Every day | ORAL | 0 refills | Status: AC

## 2024-07-17 ENCOUNTER — Ambulatory Visit: Payer: Self-pay | Admitting: Infectious Diseases
